# Patient Record
Sex: Female | Born: 1980 | Race: White | Hispanic: No | Marital: Single | Smoking: Current every day smoker
Health system: Southern US, Community
[De-identification: ages and names within clinical notes are randomized; demographics above are authoritative.]

## PROBLEM LIST (undated history)

## (undated) DIAGNOSIS — R51 Headache: Secondary | ICD-10-CM

## (undated) DIAGNOSIS — K219 Gastro-esophageal reflux disease without esophagitis: Secondary | ICD-10-CM

## (undated) DIAGNOSIS — R519 Headache, unspecified: Secondary | ICD-10-CM

## (undated) DIAGNOSIS — T7840XA Allergy, unspecified, initial encounter: Secondary | ICD-10-CM

## (undated) DIAGNOSIS — F419 Anxiety disorder, unspecified: Secondary | ICD-10-CM

## (undated) HISTORY — DX: Allergy, unspecified, initial encounter: T78.40XA

## (undated) HISTORY — PX: NO PAST SURGERIES: SHX2092

## (undated) HISTORY — DX: Gastro-esophageal reflux disease without esophagitis: K21.9

---

## 2015-09-13 ENCOUNTER — Encounter: Payer: Self-pay | Admitting: Emergency Medicine

## 2015-09-13 ENCOUNTER — Emergency Department
Admission: EM | Admit: 2015-09-13 | Discharge: 2015-09-13 | Disposition: A | Discharge status: Home / Self Care | Payer: Medicaid Other | Attending: Emergency Medicine | Admitting: Emergency Medicine

## 2015-09-13 DIAGNOSIS — F17200 Nicotine dependence, unspecified, uncomplicated: Secondary | ICD-10-CM | POA: Insufficient documentation

## 2015-09-13 DIAGNOSIS — R0982 Postnasal drip: Secondary | ICD-10-CM | POA: Diagnosis not present

## 2015-09-13 DIAGNOSIS — R0989 Other specified symptoms and signs involving the circulatory and respiratory systems: Secondary | ICD-10-CM | POA: Diagnosis present

## 2015-09-13 DIAGNOSIS — F458 Other somatoform disorders: Secondary | ICD-10-CM | POA: Diagnosis not present

## 2015-09-13 DIAGNOSIS — Z79899 Other long term (current) drug therapy: Secondary | ICD-10-CM | POA: Diagnosis not present

## 2015-09-13 LAB — POCT RAPID STREP A: Streptococcus, Group A Screen (Direct): NEGATIVE

## 2015-09-13 MED ORDER — LORATADINE 10 MG PO TABS: 10.0000 mg | ORAL_TABLET | Freq: Every day | ORAL | 0 refills | Status: DC

## 2015-09-13 MED ORDER — FLUTICASONE PROPIONATE 50 MCG/ACT NA SUSP: 2.0000 | Freq: Every day | NASAL | 0 refills | Status: AC

## 2015-09-13 NOTE — ED Provider Notes (Signed)
[facility] Emergency Department Provider Note  ____________________________________________  Time seen: Approximately 10:04 AM  I have reviewed the triage vital signs and the nursing notes.   HISTORY  Chief Complaint Sore Throat    HPI Amy Marshall is a 35 y.o. female , NAD, presents to the emergency department with three-day history of sensation of a lump in her throat. Patient states she has had sensation of lump in her throat for the past couple of days. Feels it when she swallows. Has had no pain or swelling to the throat. Did see a "bump" about the left tonsil 2 days ago but has not seen it today. Denies any specific injury or trauma that could've caused a foreign body. Has not noted any blood in her sputum. Denies any nasal congestion, runny nose, ear pain, sinus pressure. Denies any fevers, chills but did have some body aches last night and this morning. Has not taken anything over-the-counter for her symptoms. Patient verbalizes that she is concerned she may have strep throat as she has young children at home.  Does note she is being treated by gastroenterology for an ulcer and has an appointment next month for full workup.   History reviewed. No pertinent past medical history.  There are no active problems to display for this patient.   History reviewed. No pertinent surgical history.  Prior to Admission medications   Medication Sig Start Date End Date Taking? Authorizing Provider  pantoprazole (PROTONIX) 40 MG tablet Take 40 mg by mouth daily.   Yes Historical Provider, MD  sucralfate (CARAFATE) 1 g tablet Take 1 g by mouth 4 (four) times daily -  with meals and at bedtime.   Yes Historical Provider, MD  fluticasone (FLONASE) 50 MCG/ACT nasal spray Place 2 sprays into both nostrils daily. 09/13/15   Brailee Riede L Ana Liaw, PA-C  loratadine (CLARITIN) 10 MG tablet Take 1 tablet (10 mg total) by mouth daily. 09/13/15   Maizie Garno L Jonie Burdell, PA-C     Allergies Review of patient's allergies indicates no known allergies.  No family history on file.  Social History Social History  Substance Use Topics  . Smoking status: Current Every Day Smoker  . Smokeless tobacco: Never Used  . Alcohol use No     Review of Systems  Constitutional: No fever/chills Eyes: No visual changes. No discharge, Redness, swelling, pain  ENT:  No sore throat, Runny nose, nasal congestion, ear pain on the sneezing. Cardiovascular: No chest pain. Respiratory: No cough, Chest congestion. No shortness of breath. No wheezing.  Gastrointestinal: No abdominal pain.  No nausea, vomiting. Musculoskeletal: Positive general myalgias.  Skin: Negative for rash. Neurological: Negative for headaches, focal weakness or numbness. 10-point ROS otherwise negative.  ____________________________________________   PHYSICAL EXAM:  VITAL SIGNS: ED Triage Vitals  Enc Vitals Group     BP 09/13/15 0942 121/63     Pulse Rate 09/13/15 0942 70     Resp 09/13/15 0942 20     Temp 09/13/15 0942 98.2 F (36.8 C)     Temp Source 09/13/15 0942 Oral     SpO2 09/13/15 0942 96 %     Weight 09/13/15 0943 145 lb (65.8 kg)     Height 09/13/15 0943 5\' 2"  (1.575 m)     Head Circumference --      Peak Flow --      Pain Score 09/13/15 0943 3     Pain Loc --      Pain Edu? --  Excl. in GC? --      Constitutional: Alert and oriented. Well appearing and in no acute distress. Eyes: Conjunctivae are normal without icterus or injection. PERRL. EOMI without pain.  Head: Atraumatic. ENT:      Ears: Right TM visualized with mild serous effusion but no erythema, bulging, perforation. Left TM visualized without effusion, bulging, erythema, perforation.      Nose: No congestion but trace clear rhinnorhea.      Mouth/Throat: Mucous membranes are moist. Pharynx without erythema, swelling, exudate. Uvula is midline. Airway is patent. Clear postnasal drip. Neck: No stridor. No carotid  bruits. Supple with full range of motion. Hematological/Lymphatic/Immunilogical: No cervical, preauricular, postauricular, supraclavicular lymphadenopathy. Cardiovascular: Normal rate, regular rhythm. Normal S1 and S2.  Good peripheral circulation. Respiratory: Normal respiratory effort without tachypnea or retractions. Lungs CTAB with breath sounds noted in all lung fields. No wheeze, rhonchi, rales. Neurologic:  Normal speech and language. No gross focal neurologic deficits are appreciated.  Skin:  Skin is warm, dry and intact. No rash noted. Psychiatric: Mood and affect are normal. Speech and behavior are normal. Patient exhibits appropriate insight and judgement.   ____________________________________________   LABS (all labs ordered are listed, but only abnormal results are displayed)  Labs Reviewed  CULTURE, GROUP A STREP [facility])  POCT RAPID STREP A   ____________________________________________  EKG  None ____________________________________________  RADIOLOGY  None ____________________________________________    PROCEDURES  Procedure(s) performed: None   Procedures   Medications - No data to display   ____________________________________________   INITIAL IMPRESSION / ASSESSMENT AND PLAN / ED COURSE  Pertinent labs & imaging results that were available during my care of the patient were reviewed by me and considered in my medical decision making (see chart for details).  Clinical Course    Patient's diagnosis is consistent with Globus sensation due to postnasal drip. Patient will be discharged home with prescriptions for Flonase and Claritin to take as directed. Patient should continue Protonix and sucralfate as previously prescribed. Patient [facility] to [facility] or her GI specialist if symptoms persist past this treatment course. Patient is given ED precautions to return to the ED for any worsening or new symptoms.     ____________________________________________  FINAL CLINICAL IMPRESSION(S) / ED DIAGNOSES  Final diagnoses:  Globus sensation  Postnasal drip      NEW MEDICATIONS STARTED DURING THIS VISIT:  Discharge Medication List as of 09/13/2015 10:48 AM    START taking these medications   Details  fluticasone (FLONASE) 50 MCG/ACT nasal spray Place 2 sprays into both nostrils daily., Starting Wed 09/13/2015, Print    loratadine (CLARITIN) 10 MG tablet Take 1 tablet (10 mg total) by mouth daily., Starting Wed 09/13/2015, Print             Ernestene KielJami L New SalisburyHagler, PA-C 09/13/15 1111    Nita Sicklearolina Veronese, MD 09/14/15 [phone]

## 2015-09-13 NOTE — Discharge Instructions (Signed)
If not improving in 3-4 days, follow up with PCP or [facility]

## 2015-09-13 NOTE — ED Triage Notes (Signed)
States she felt like she has a lump in her throat for the past 2-3 days  Then developed some body aches last pm  Also states soreness moves into neck   Afebrile on arrival

## 2015-09-15 LAB — CULTURE, GROUP A STREP (THRC)

## 2015-10-11 ENCOUNTER — Other Ambulatory Visit: Payer: Self-pay | Admitting: Gastroenterology

## 2015-10-11 DIAGNOSIS — R1310 Dysphagia, unspecified: Secondary | ICD-10-CM

## 2015-10-11 DIAGNOSIS — R1013 Epigastric pain: Secondary | ICD-10-CM | POA: Insufficient documentation

## 2015-10-16 ENCOUNTER — Ambulatory Visit
Admission: RE | Admit: 2015-10-16 | Discharge: 2015-10-16 | Disposition: A | Discharge status: Home / Self Care | Payer: Medicaid Other | Source: Ambulatory Visit | Attending: Gastroenterology | Admitting: Gastroenterology

## 2015-10-16 DIAGNOSIS — R1310 Dysphagia, unspecified: Secondary | ICD-10-CM | POA: Diagnosis not present

## 2015-11-08 ENCOUNTER — Encounter: Payer: Self-pay | Admitting: *Deleted

## 2015-11-08 DIAGNOSIS — R1013 Epigastric pain: Secondary | ICD-10-CM | POA: Diagnosis present

## 2015-11-08 DIAGNOSIS — F17200 Nicotine dependence, unspecified, uncomplicated: Secondary | ICD-10-CM | POA: Diagnosis not present

## 2015-11-08 DIAGNOSIS — Z79899 Other long term (current) drug therapy: Secondary | ICD-10-CM | POA: Insufficient documentation

## 2015-11-08 DIAGNOSIS — K8020 Calculus of gallbladder without cholecystitis without obstruction: Secondary | ICD-10-CM | POA: Diagnosis not present

## 2015-11-08 LAB — URINALYSIS COMPLETE WITH MICROSCOPIC (ARMC ONLY)
Bacteria, UA: NONE SEEN
Bilirubin Urine: NEGATIVE
Glucose, UA: NEGATIVE mg/dL
KETONES UR: NEGATIVE mg/dL
LEUKOCYTES UA: NEGATIVE
NITRITE: NEGATIVE
PH: 6 (ref 5.0–8.0)
PROTEIN: NEGATIVE mg/dL
SPECIFIC GRAVITY, URINE: 1.008 (ref 1.005–1.030)

## 2015-11-08 LAB — COMPREHENSIVE METABOLIC PANEL
ALK PHOS: 64 U/L (ref 38–126)
ALT: 16 U/L (ref 14–54)
ANION GAP: 7 (ref 5–15)
AST: 17 U/L (ref 15–41)
Albumin: 4.5 g/dL (ref 3.5–5.0)
BUN: 11 mg/dL (ref 6–20)
CALCIUM: 9 mg/dL (ref 8.9–10.3)
CO2: 25 mmol/L (ref 22–32)
Chloride: 106 mmol/L (ref 101–111)
Creatinine, Ser: 0.53 mg/dL (ref 0.44–1.00)
Glucose, Bld: 108 mg/dL — ABNORMAL HIGH (ref 65–99)
Potassium: 3.7 mmol/L (ref 3.5–5.1)
SODIUM: 138 mmol/L (ref 135–145)
TOTAL PROTEIN: 7.3 g/dL (ref 6.5–8.1)
Total Bilirubin: 0.5 mg/dL (ref 0.3–1.2)

## 2015-11-08 LAB — POCT PREGNANCY, URINE: PREG TEST UR: NEGATIVE

## 2015-11-08 LAB — CBC
HCT: 43.9 % (ref 35.0–47.0)
HEMOGLOBIN: 14.9 g/dL (ref 12.0–16.0)
MCH: 30.9 pg (ref 26.0–34.0)
MCHC: 34.1 g/dL (ref 32.0–36.0)
MCV: 90.7 fL (ref 80.0–100.0)
Platelets: 210 10*3/uL (ref 150–440)
RBC: 4.84 MIL/uL (ref 3.80–5.20)
RDW: 12.4 % (ref 11.5–14.5)
WBC: 12.5 10*3/uL — AB (ref 3.6–11.0)

## 2015-11-08 LAB — LIPASE, BLOOD: Lipase: 24 U/L (ref 11–51)

## 2015-11-08 NOTE — ED Triage Notes (Addendum)
Pt has upper abd pain.  Pt vomited x 1 today.  No diarrhea.  Hx of ulcers.  Pt tried otc meds without relief of pain today.  Pt denies urinary sx.  No vag bleeding.

## 2015-11-09 ENCOUNTER — Emergency Department: Payer: Medicaid Other

## 2015-11-09 ENCOUNTER — Emergency Department
Admission: EM | Admit: 2015-11-09 | Discharge: 2015-11-09 | Disposition: A | Discharge status: Home / Self Care | Payer: Medicaid Other | Attending: Emergency Medicine | Admitting: Emergency Medicine

## 2015-11-09 ENCOUNTER — Encounter: Payer: Self-pay | Admitting: Emergency Medicine

## 2015-11-09 DIAGNOSIS — R1013 Epigastric pain: Secondary | ICD-10-CM

## 2015-11-09 DIAGNOSIS — K8020 Calculus of gallbladder without cholecystitis without obstruction: Secondary | ICD-10-CM

## 2015-11-09 MED ORDER — ONDANSETRON 4 MG PO TBDP: 4.0000 mg | ORAL_TABLET | Freq: Three times a day (TID) | ORAL | 0 refills | Status: DC | PRN

## 2015-11-09 MED ORDER — SODIUM CHLORIDE 0.9 % IV BOLUS (SEPSIS)
500.0000 mL | Freq: Once | INTRAVENOUS | Status: AC
  Administered 2015-11-09: 500 mL via INTRAVENOUS

## 2015-11-09 MED ORDER — FAMOTIDINE IN NACL 20-0.9 MG/50ML-% IV SOLN
20.0000 mg | Freq: Once | INTRAVENOUS | Status: AC
  Administered 2015-11-09: 20 mg via INTRAVENOUS
  Filled 2015-11-09: qty 50

## 2015-11-09 MED ORDER — OXYCODONE-ACETAMINOPHEN 5-325 MG PO TABS: 1.0000 | ORAL_TABLET | ORAL | 0 refills | Status: DC | PRN

## 2015-11-09 MED ORDER — ONDANSETRON HCL 4 MG/2ML IJ SOLN
4.0000 mg | Freq: Once | INTRAMUSCULAR | Status: AC
  Administered 2015-11-09: 4 mg via INTRAVENOUS
  Filled 2015-11-09: qty 2

## 2015-11-09 MED ORDER — MORPHINE SULFATE (PF) 2 MG/ML IV SOLN
2.0000 mg | Freq: Once | INTRAVENOUS | Status: AC
  Administered 2015-11-09: 2 mg via INTRAVENOUS
  Filled 2015-11-09: qty 1

## 2015-11-09 NOTE — Discharge Instructions (Signed)
1. Take medicines as needed for pain & nausea (Percocet/Zofran #20). 2. Clear liquids x 12 hours, then bland diet x 1 week, then slowly advance diet as tolerated. Avoid fatty, greasy, spicy foods and drinks. 3. Return to the ER for worsening symptoms, persistent vomiting, fever, difficulty breathing or other concerns.  

## 2015-11-09 NOTE — ED Notes (Signed)
Pt taken to US

## 2015-11-09 NOTE — ED Provider Notes (Signed)
[facility] Emergency Department Provider Note   ____________________________________________   First MD Initiated Contact with Patient 11/09/15 0115     (approximate)  I have reviewed the triage vital signs and the nursing notes.   HISTORY  Chief Complaint Abdominal Pain    HPI Amy Marshall is a 35 y.o. female who presents to the ED from home with a chief complaint of abdominal pain. Patient reports history of ulcers, on Protonix and Carafate. Reports onset of epigastric burning discomfort approximately 8 PM. Pain radiates straight through to her back. Ate scrambled eggs for dinner. Symptoms associated with nausea/vomiting x 1. Denies associated fever, chills, chest pain, shortness of breath, dysuria, diarrhea. Denies recent travel or trauma. Nothing makes her symptoms better or worse.   Past medical history Ulcers  There are no active problems to display for this patient.   History reviewed. No pertinent surgical history.  Prior to Admission medications   Medication Sig Start Date End Date Taking? Authorizing Provider  pantoprazole (PROTONIX) 40 MG tablet Take 40 mg by mouth daily.   Yes Historical Provider, MD  sucralfate (CARAFATE) 1 g tablet Take 1 g by mouth 4 (four) times daily -  with meals and at bedtime.   Yes Historical Provider, MD  fluticasone (FLONASE) 50 MCG/ACT nasal spray Place 2 sprays into both nostrils daily. 09/13/15   Jami L Hagler, PA-C  loratadine (CLARITIN) 10 MG tablet Take 1 tablet (10 mg total) by mouth daily. 09/13/15   Jami L Hagler, PA-C  ondansetron (ZOFRAN ODT) 4 MG disintegrating tablet Take 1 tablet (4 mg total) by mouth every 8 (eight) hours as needed for nausea or vomiting. 11/09/15   Irean Hong, MD  oxyCODONE-acetaminophen (ROXICET) 5-325 MG tablet Take 1 tablet by mouth every 4 (four) hours as needed for severe pain. 11/09/15   Irean Hong, MD    Allergies Review of patient's allergies indicates no known  allergies.  History reviewed. No pertinent family history.  Social History Social History  Substance Use Topics  . Smoking status: Current Every Day Smoker  . Smokeless tobacco: Never Used  . Alcohol use No    Review of Systems  Constitutional: No fever/chills. Eyes: No visual changes. ENT: No sore throat. Cardiovascular: Denies chest pain. Respiratory: Denies shortness of breath. Gastrointestinal: Positive for abdominal pain, nausea and vomiting.  No diarrhea.  No constipation. Genitourinary: Negative for dysuria. Musculoskeletal: Negative for back pain. Skin: Negative for rash. Neurological: Negative for headaches, focal weakness or numbness.  10-point ROS otherwise negative.  ____________________________________________   PHYSICAL EXAM:  VITAL SIGNS: ED Triage Vitals [11/08/15 2245]  Enc Vitals Group     BP 129/82     Pulse Rate 100     Resp 20     Temp 97.9 F (36.6 C)     Temp Source Oral     SpO2 100 %     Weight 138 lb (62.6 kg)     Height 5\' 2"  (1.575 m)     Head Circumference      Peak Flow      Pain Score 10     Pain Loc      Pain Edu?      Excl. in GC?     Constitutional: Alert and oriented. Well appearing and in no acute distress. Eyes: Conjunctivae are normal. PERRL. EOMI. Head: Atraumatic. Nose: No congestion/rhinnorhea. Mouth/Throat: Mucous membranes are moist.  Oropharynx non-erythematous. Neck: No stridor.   Cardiovascular: Normal rate, regular rhythm.  Grossly normal heart sounds.  Good peripheral circulation. Respiratory: Normal respiratory effort.  No retractions. Lungs CTAB. Gastrointestinal: Soft and mildly tender to palpation epigastrium without rebound or guarding. No distention. No abdominal bruits. No CVA tenderness. Musculoskeletal: No lower extremity tenderness nor edema.  No joint effusions. Neurologic:  Normal speech and language. No gross focal neurologic deficits are appreciated. No gait instability. Skin:  Skin is warm,  dry and intact. No rash noted. Psychiatric: Mood and affect are normal. Speech and behavior are normal.  ____________________________________________   LABS (all labs ordered are listed, but only abnormal results are displayed)  Labs Reviewed  COMPREHENSIVE METABOLIC PANEL - Abnormal; Notable for the following:       Result Value   Glucose, Bld 108 (*)    All other components within normal limits  CBC - Abnormal; Notable for the following:    WBC 12.5 (*)    All other components within normal limits  URINALYSIS COMPLETEWITH MICROSCOPIC ([facility] ONLY) - Abnormal; Notable for the following:    Color, Urine STRAW (*)    APPearance CLEAR (*)    Hgb urine dipstick 2+ (*)    Squamous Epithelial / LPF 0-5 (*)    All other components within normal limits  LIPASE, BLOOD  POC URINE PREG, ED  POCT PREGNANCY, URINE   ____________________________________________  EKG  None ____________________________________________  RADIOLOGY  RUQ ultrasound interpreted per Dr. Gwenyth Benderadparvar: Cholelithiasis without sonographic evidence of acute cholecystitis. ____________________________________________   PROCEDURES  Procedure(s) performed: None  Procedures  Critical Care performed: No  ____________________________________________   INITIAL IMPRESSION / ASSESSMENT AND PLAN / ED COURSE  Pertinent labs & imaging results that were available during my care of the patient were reviewed by me and considered in my medical decision making (see chart for details).  35 year old female with a history of ulcers who presents with burning-type epigastric discomfort. Laboratory and urinalysis results are unremarkable. Will administer IV Pepcid, analgesia and proceed with ultrasound to evaluate cholecystitis.  Clinical Course  Comment By Time  Patient resting in no acute distress. Updated her of ultrasound results. Plan for prescriptions for analgesia, antiemetic and outpatient general surgery follow-up.  Strict return precautions given. Patient verbalizes understanding and agree with plan. Irean HongJade J Sung, MD 10/26 [phone]     ____________________________________________   FINAL CLINICAL IMPRESSION(S) / ED DIAGNOSES  Final diagnoses:  Epigastric pain  Calculus of gallbladder without cholecystitis without obstruction      NEW MEDICATIONS STARTED DURING THIS VISIT:  New Prescriptions   ONDANSETRON (ZOFRAN ODT) 4 MG DISINTEGRATING TABLET    Take 1 tablet (4 mg total) by mouth every 8 (eight) hours as needed for nausea or vomiting.   OXYCODONE-ACETAMINOPHEN (ROXICET) 5-325 MG TABLET    Take 1 tablet by mouth every 4 (four) hours as needed for severe pain.     Note:  This document was prepared using Dragon voice recognition software and may include unintentional dictation errors.    Irean HongJade J Sung, MD 11/09/15 [phone]

## 2015-11-09 NOTE — ED Notes (Signed)
Dr. Sung at bedside.  

## 2015-11-10 ENCOUNTER — Other Ambulatory Visit: Payer: Self-pay

## 2015-11-13 ENCOUNTER — Emergency Department
Admission: EM | Admit: 2015-11-13 | Discharge: 2015-11-13 | Disposition: A | Discharge status: Home / Self Care | Payer: Medicaid Other | Attending: Emergency Medicine | Admitting: Emergency Medicine

## 2015-11-13 ENCOUNTER — Encounter: Payer: Self-pay | Admitting: Emergency Medicine

## 2015-11-13 ENCOUNTER — Other Ambulatory Visit: Payer: Self-pay

## 2015-11-13 DIAGNOSIS — Z79899 Other long term (current) drug therapy: Secondary | ICD-10-CM | POA: Diagnosis not present

## 2015-11-13 DIAGNOSIS — F17200 Nicotine dependence, unspecified, uncomplicated: Secondary | ICD-10-CM | POA: Insufficient documentation

## 2015-11-13 DIAGNOSIS — R1010 Upper abdominal pain, unspecified: Secondary | ICD-10-CM | POA: Diagnosis present

## 2015-11-13 DIAGNOSIS — K8050 Calculus of bile duct without cholangitis or cholecystitis without obstruction: Secondary | ICD-10-CM | POA: Diagnosis not present

## 2015-11-13 DIAGNOSIS — R110 Nausea: Secondary | ICD-10-CM

## 2015-11-13 LAB — COMPREHENSIVE METABOLIC PANEL
ALBUMIN: 4.6 g/dL (ref 3.5–5.0)
ALK PHOS: 66 U/L (ref 38–126)
ALT: 17 U/L (ref 14–54)
AST: 20 U/L (ref 15–41)
Anion gap: 7 (ref 5–15)
BUN: 10 mg/dL (ref 6–20)
CALCIUM: 9.2 mg/dL (ref 8.9–10.3)
CHLORIDE: 105 mmol/L (ref 101–111)
CO2: 26 mmol/L (ref 22–32)
CREATININE: 0.64 mg/dL (ref 0.44–1.00)
GFR calc Af Amer: >60 mL/min (ref >60)
GFR calc non Af Amer: >60 mL/min (ref >60)
GLUCOSE: 119 mg/dL — AB (ref 65–99)
Potassium: 4 mmol/L (ref 3.5–5.1)
SODIUM: 138 mmol/L (ref 135–145)
Total Bilirubin: 0.6 mg/dL (ref 0.3–1.2)
Total Protein: 7.8 g/dL (ref 6.5–8.1)

## 2015-11-13 LAB — CBC
HCT: 44.6 % (ref 35.0–47.0)
Hemoglobin: 15.2 g/dL (ref 12.0–16.0)
MCH: 30.8 pg (ref 26.0–34.0)
MCHC: 34.2 g/dL (ref 32.0–36.0)
MCV: 90.2 fL (ref 80.0–100.0)
PLATELETS: 225 10*3/uL (ref 150–440)
RBC: 4.95 MIL/uL (ref 3.80–5.20)
RDW: 12.4 % (ref 11.5–14.5)
WBC: 19 10*3/uL — ABNORMAL HIGH (ref 3.6–11.0)

## 2015-11-13 LAB — LIPASE, BLOOD: LIPASE: 19 U/L (ref 11–51)

## 2015-11-13 MED ORDER — ONDANSETRON 4 MG PO TBDP: 4.0000 mg | ORAL_TABLET | Freq: Three times a day (TID) | ORAL | 0 refills | Status: DC | PRN

## 2015-11-13 MED ORDER — CEPHALEXIN 500 MG PO CAPS: 500.0000 mg | ORAL_CAPSULE | Freq: Three times a day (TID) | ORAL | 0 refills | Status: DC

## 2015-11-13 MED ORDER — OXYCODONE-ACETAMINOPHEN 5-325 MG PO TABS
1.0000 | ORAL_TABLET | Freq: Once | ORAL | Status: AC
  Administered 2015-11-13: 1 via ORAL

## 2015-11-13 MED ORDER — MORPHINE SULFATE (PF) 2 MG/ML IV SOLN
4.0000 mg | Freq: Once | INTRAVENOUS | Status: AC
  Administered 2015-11-13: 4 mg via INTRAVENOUS

## 2015-11-13 MED ORDER — OXYCODONE-ACETAMINOPHEN 10-325 MG PO TABS: 1.0000 | ORAL_TABLET | Freq: Four times a day (QID) | ORAL | 0 refills | Status: DC | PRN

## 2015-11-13 MED ORDER — ONDANSETRON 4 MG PO TBDP
ORAL_TABLET | ORAL | Status: AC
Start: 2015-11-13 — End: 2015-11-13
  Administered 2015-11-13: 4 mg via ORAL
  Filled 2015-11-13: qty 1

## 2015-11-13 MED ORDER — HYDROMORPHONE HCL 1 MG/ML IJ SOLN
INTRAMUSCULAR | Status: AC
  Administered 2015-11-13: 0.5 mg via INTRAVENOUS
  Filled 2015-11-13: qty 1

## 2015-11-13 MED ORDER — CEPHALEXIN 500 MG PO CAPS
ORAL_CAPSULE | ORAL | Status: AC
  Administered 2015-11-13: 500 mg via ORAL
  Filled 2015-11-13: qty 1

## 2015-11-13 MED ORDER — HYDROMORPHONE HCL 1 MG/ML IJ SOLN
0.5000 mg | Freq: Once | INTRAMUSCULAR | Status: AC
  Administered 2015-11-13: 0.5 mg via INTRAVENOUS

## 2015-11-13 MED ORDER — ONDANSETRON 4 MG PO TBDP
4.0000 mg | ORAL_TABLET | Freq: Once | ORAL | Status: AC
  Administered 2015-11-13: 4 mg via ORAL

## 2015-11-13 MED ORDER — OXYCODONE-ACETAMINOPHEN 5-325 MG PO TABS
ORAL_TABLET | ORAL | Status: AC
  Administered 2015-11-13: 1 via ORAL
  Filled 2015-11-13: qty 2

## 2015-11-13 MED ORDER — MORPHINE SULFATE (PF) 2 MG/ML IV SOLN
INTRAVENOUS | Status: AC
  Administered 2015-11-13: 4 mg via INTRAVENOUS
  Filled 2015-11-13: qty 1

## 2015-11-13 MED ORDER — ONDANSETRON HCL 4 MG/2ML IJ SOLN
4.0000 mg | Freq: Once | INTRAMUSCULAR | Status: AC
  Administered 2015-11-13: 4 mg via INTRAVENOUS
  Filled 2015-11-13: qty 2

## 2015-11-13 MED ORDER — ONDANSETRON HCL 4 MG/2ML IJ SOLN
4.0000 mg | Freq: Once | INTRAMUSCULAR | Status: AC
  Administered 2015-11-13: 4 mg via INTRAVENOUS

## 2015-11-13 MED ORDER — CEPHALEXIN 500 MG PO CAPS
500.0000 mg | ORAL_CAPSULE | Freq: Once | ORAL | Status: AC
  Administered 2015-11-13: 500 mg via ORAL

## 2015-11-13 MED ORDER — ONDANSETRON HCL 4 MG/2ML IJ SOLN
4.0000 mg | Freq: Once | INTRAMUSCULAR | Status: AC | PRN
  Administered 2015-11-13: 4 mg via INTRAVENOUS
  Filled 2015-11-13 (×2): qty 2

## 2015-11-13 MED ORDER — MORPHINE SULFATE (PF) 2 MG/ML IV SOLN
4.0000 mg | Freq: Once | INTRAVENOUS | Status: AC
  Administered 2015-11-13: 4 mg via INTRAVENOUS
  Filled 2015-11-13: qty 2

## 2015-11-13 MED ORDER — MORPHINE SULFATE (PF) 2 MG/ML IV SOLN
INTRAVENOUS | Status: AC
  Filled 2015-11-13: qty 1

## 2015-11-13 NOTE — ED Triage Notes (Signed)
Pt seen here this past Wednesday and diagnosed with gallstones; has appointment this next Wednesday with surgeon; pt says taking prescribed percocet for pain with no relief; also taking Zofran with little relief;

## 2015-11-13 NOTE — ED Notes (Signed)
Pt discharged to home.  Family member driving.  Discharge instructions reviewed.  Verbalized understanding.  No questions or concerns at this time.  Teach back verified.  Pt in NAD.  No items left in ED.   

## 2015-11-13 NOTE — Discharge Instructions (Signed)
1. Take pain and nausea medicines as needed (Percocet/Zofran). 2. Continue bland diet. Avoid greasy, spicy foods and alcohol. 3. Return to the ER for worsening symptoms, persistent vomiting, fever, difficulty breathing or other concerns.

## 2015-11-13 NOTE — ED Provider Notes (Signed)
[facility] Emergency Department Provider Note   ____________________________________________   First MD Initiated Contact with Patient 11/13/15 0423     (approximate)  I have reviewed the triage vital signs and the nursing notes.   HISTORY  Chief Complaint Abdominal Pain; Nausea; and Emesis    HPI Amy Marshall is a 35 y.o. female who returns to the ED from home with a chief complaint of abdominal pain. Patient wasseen for same on 10/26, found to have gallstones on ultrasound and discharged home with Percocet. Patient reports she has an appointment with the surgeon on November 1, but presents for increased abdominal pain last evening. Symptoms associated with nausea only. Denies fever, chills, chest pain, shortness of breath, diarrhea, dysuria. Denies recent travel or trauma. Nothing makes her symptoms better or worse.   Past medical history Ulcers  Patient Active Problem List   Diagnosis Date Noted  . Dyspepsia 10/11/2015    History reviewed. No pertinent surgical history.  Prior to Admission medications   Medication Sig Start Date End Date Taking? Authorizing Provider  fluticasone (FLONASE) 50 MCG/ACT nasal spray Place 2 sprays into both nostrils daily. 09/13/15  Yes Jami L Hagler, PA-C  loratadine (CLARITIN) 10 MG tablet Take 1 tablet (10 mg total) by mouth daily. 09/13/15  Yes Jami L Hagler, PA-C  pantoprazole (PROTONIX) 40 MG tablet Take by mouth.   Yes Historical Provider, MD  sucralfate (CARAFATE) 1 g tablet Take 1 g by mouth 4 (four) times daily -  with meals and at bedtime.   Yes Historical Provider, MD  sucralfate (CARAFATE) 1 g tablet Take by mouth.   Yes Historical Provider, MD  cephALEXin (KEFLEX) 500 MG capsule Take 1 capsule (500 mg total) by mouth 3 (three) times daily. 11/13/15   Irean HongJade J Stanford Strauch, MD  ondansetron (ZOFRAN ODT) 4 MG disintegrating tablet Take 1 tablet (4 mg total) by mouth every 8 (eight) hours as needed for nausea or  vomiting. 11/13/15   Irean HongJade J Greycen Felter, MD  oxyCODONE-acetaminophen (PERCOCET) 10-325 MG tablet Take 1 tablet by mouth every 6 (six) hours as needed for pain. 11/13/15   Irean HongJade J Franshesca Chipman, MD    Allergies Codeine  History reviewed. No pertinent family history.  Social History Social History  Substance Use Topics  . Smoking status: Current Every Day Smoker  . Smokeless tobacco: Never Used  . Alcohol use No    Review of Systems  Constitutional: No fever/chills. Eyes: No visual changes. ENT: No sore throat. Cardiovascular: Denies chest pain. Respiratory: Denies shortness of breath. Gastrointestinal: Positive for abdominal pain.  Positive for nausea, no vomiting.  No diarrhea.  No constipation. Genitourinary: Negative for dysuria. Musculoskeletal: Negative for back pain. Skin: Negative for rash. Neurological: Negative for headaches, focal weakness or numbness.  10-point ROS otherwise negative.  ____________________________________________   PHYSICAL EXAM:  VITAL SIGNS: ED Triage Vitals  Enc Vitals Group     BP 11/13/15 0200 133/84     Pulse Rate 11/13/15 0200 80     Resp 11/13/15 0200 18     Temp 11/13/15 0200 98.4 F (36.9 C)     Temp Source 11/13/15 0200 Oral     SpO2 11/13/15 0200 100 %     Weight 11/13/15 0200 138 lb (62.6 kg)     Height 11/13/15 0200 5\' 2"  (1.575 m)     Head Circumference --      Peak Flow --      Pain Score 11/13/15 0202 10  Pain Loc --      Pain Edu? --      Excl. in GC? --     Constitutional: Alert and oriented. Well appearing and in mild acute distress. Eyes: Conjunctivae are normal. PERRL. EOMI. Head: Atraumatic. Nose: No congestion/rhinnorhea. Mouth/Throat: Mucous membranes are moist.  Oropharynx non-erythematous. Neck: No stridor.   Cardiovascular: Normal rate, regular rhythm. Grossly normal heart sounds.  Good peripheral circulation. Respiratory: Normal respiratory effort.  No retractions. Lungs CTAB. Gastrointestinal: Soft and tender  to palpation epigastrium and right upper quadrant without rebound or guarding. No distention. No abdominal bruits. No CVA tenderness. Musculoskeletal: No lower extremity tenderness nor edema.  No joint effusions. Neurologic:  Normal speech and language. No gross focal neurologic deficits are appreciated. No gait instability. Skin:  Skin is warm, dry and intact. No rash noted. Psychiatric: Mood and affect are normal. Speech and behavior are normal.  ____________________________________________   LABS (all labs ordered are listed, but only abnormal results are displayed)  Labs Reviewed  COMPREHENSIVE METABOLIC PANEL - Abnormal; Notable for the following:       Result Value   Glucose, Bld 119 (*)    All other components within normal limits  CBC - Abnormal; Notable for the following:    WBC 19.0 (*)    All other components within normal limits  LIPASE, BLOOD   ____________________________________________  EKG  None ____________________________________________  RADIOLOGY  None ____________________________________________   PROCEDURES  Procedure(s) performed: None  Procedures  Critical Care performed: No  ____________________________________________   INITIAL IMPRESSION / ASSESSMENT AND PLAN / ED COURSE  Pertinent labs & imaging results that were available during my care of the patient were reviewed by me and considered in my medical decision making (see chart for details).  35 year old female who returns to the emergency department for persistent pain associated with cholelithiasis. Leukocytosis noted. LFTs and lipase remain normal. We'll administer IV analgesia and reassess.  Clinical Course  Comment By Time  Spoke with Dr. Excell Seltzerooper from general surgery who does not recommend repeat ultrasound.Goal to control patient's pain and discharge her to home with follow-up as scheduled in 2 days. Irean HongJade J Charletta Voight, MD 10/30 [phone]  Patient's pain down to 4/10 after Dilaudid. Will  prescribe 10 mg Percocet along with ODT Zofran. Will initiate Keflex empirically. Patient has an appointment on November 1 with surgery. Strict return precautions given. Patient and spouse verbalize understanding and agree with plan of care. Irean HongJade J Leaira Fullam, MD 10/30 0555     ____________________________________________   FINAL CLINICAL IMPRESSION(S) / ED DIAGNOSES  Final diagnoses:  Pain of upper abdomen  Nausea  Biliary colic      NEW MEDICATIONS STARTED DURING THIS VISIT:  New Prescriptions   CEPHALEXIN (KEFLEX) 500 MG CAPSULE    Take 1 capsule (500 mg total) by mouth 3 (three) times daily.   ONDANSETRON (ZOFRAN ODT) 4 MG DISINTEGRATING TABLET    Take 1 tablet (4 mg total) by mouth every 8 (eight) hours as needed for nausea or vomiting.   OXYCODONE-ACETAMINOPHEN (PERCOCET) 10-325 MG TABLET    Take 1 tablet by mouth every 6 (six) hours as needed for pain.     Note:  This document was prepared using Dragon voice recognition software and may include unintentional dictation errors.    Irean HongJade J Shamal Stracener, MD 11/13/15 [phone]

## 2015-11-15 ENCOUNTER — Ambulatory Visit (INDEPENDENT_AMBULATORY_CARE_PROVIDER_SITE_OTHER): Payer: Medicaid Other | Admitting: Surgery

## 2015-11-15 ENCOUNTER — Encounter: Payer: Self-pay | Admitting: Surgery

## 2015-11-15 VITALS — BP 116/71 | HR 88 | Temp 98.5°F | Ht 62.0 in | Wt 136.8 lb

## 2015-11-15 DIAGNOSIS — K8020 Calculus of gallbladder without cholecystitis without obstruction: Secondary | ICD-10-CM

## 2015-11-15 MED ORDER — AMOXICILLIN-POT CLAVULANATE 875-125 MG PO TABS: 1.0000 | ORAL_TABLET | Freq: Two times a day (BID) | ORAL | 0 refills | Status: DC

## 2015-11-15 NOTE — Patient Instructions (Signed)
We will start you on a different antibiotic for your gallbladder. Stop the Keflex and start the Augmentin today. Take this medication until it is completely gone.  Please review information below regarding gallstones. If you have severe pain, nausea and vomiting, or fever >100.5 please call our office immediately and speak with a nurse. If this occurs after hours, please go to the Emergency Room and we will have a surgeon see you there.  Please see your follow-up information below.  Call with any questions or concerns that you may have.    Cholelithiasis Cholelithiasis (also called gallstones) is a form of gallbladder disease in which gallstones form in your gallbladder. The gallbladder is an organ that stores bile made in the liver, which helps digest fats. Gallstones begin as small crystals and slowly grow into stones. Gallstone pain occurs when the gallbladder spasms and a gallstone is blocking the duct. Pain can also occur when a stone passes out of the duct.  RISK FACTORS  Being female.   Having multiple pregnancies. Health care providers sometimes advise removing diseased gallbladders before future pregnancies.   Being obese.  Eating a diet heavy in fried foods and fat.   Being older than 60 years and increasing age.   Prolonged use of medicines containing female hormones.   Having diabetes mellitus.   Rapidly losing weight.   Having a family history of gallstones (heredity).  SYMPTOMS  Nausea.   Vomiting.  Abdominal pain.   Yellowing of the skin (jaundice).   Sudden pain. It may persist from several minutes to several hours.  Fever.   Tenderness to the touch. In some cases, when gallstones do not move into the bile duct, people have no pain or symptoms. These are called "silent" gallstones.  TREATMENT Silent gallstones do not need treatment. In severe cases, emergency surgery may be required. Options for treatment include:  Surgery to remove the  gallbladder. This is the most common treatment.  Medicines. These do not always work and may take 6-12 months or more to work.  Shock wave treatment (extracorporeal biliary lithotripsy). In this treatment an ultrasound machine sends shock waves to the gallbladder to break gallstones into smaller pieces that can pass into the intestines or be dissolved by medicine. HOME CARE INSTRUCTIONS   Only take over-the-counter or prescription medicines for pain, discomfort, or fever as directed by your health care provider.   Follow a low-fat diet until seen again by your health care provider. Fat causes the gallbladder to contract, which can result in pain.   Follow up with your health care provider as directed. Attacks are almost always recurrent and surgery is usually required for permanent treatment.  SEEK IMMEDIATE MEDICAL CARE IF:   Your pain increases and is not controlled by medicines.   You have a fever or persistent symptoms for more than 2-3 days.   You have a fever and your symptoms suddenly get worse.   You have persistent nausea and vomiting.  MAKE SURE YOU:   Understand these instructions.  Will watch your condition.  Will get help right away if you are not doing well or get worse.   This information is not intended to replace advice given to you by your health care provider. Make sure you discuss any questions you have with your health care provider.   Document Released: 12/27/2004 Document Revised: 09/02/2012 Document Reviewed: 06/24/2012 Elsevier Interactive Patient Education 2016 Elsevier Inc.     Low-Fat Diet for Pancreatitis or Gallbladder Conditions A low-fat  diet can be helpful if you have pancreatitis or a gallbladder condition. With these conditions, your pancreas and gallbladder have trouble digesting fats. A healthy eating plan with less fat will help rest your pancreas and gallbladder and reduce your symptoms. WHAT DO I NEED TO KNOW ABOUT THIS  DIET?  Eat a low-fat diet.  Reduce your fat intake to less than 20-30% of your total daily calories. This is less than 50-60 g of fat per day.  Remember that you need some fat in your diet. Ask your dietician what your daily goal should be.  Choose nonfat and low-fat healthy foods. Look for the words "nonfat," "low fat," or "fat free."  As a guide, look on the label and choose foods with less than 3 g of fat per serving. Eat only one serving.  Avoid alcohol.  Do not smoke. If you need help quitting, talk with your health care provider.  Eat small frequent meals instead of three large heavy meals. WHAT FOODS CAN I EAT? Grains Include healthy grains and starches such as potatoes, wheat bread, fiber-rich cereal, and brown rice. Choose whole grain options whenever possible. In adults, whole grains should account for 45-65% of your daily calories.  Fruits and Vegetables Eat plenty of fruits and vegetables. Fresh fruits and vegetables add fiber to your diet. Meats and Other Protein Sources Eat lean meat such as chicken and pork. Trim any fat off of meat before cooking it. Eggs, fish, and beans are other sources of protein. In adults, these foods should account for 10-35% of your daily calories. Dairy Choose low-fat milk and dairy options. Dairy includes fat and protein, as well as calcium.  Fats and Oils Limit high-fat foods such as fried foods, sweets, baked goods, sugary drinks.  Other Creamy sauces and condiments, such as mayonnaise, can add extra fat. Think about whether or not you need to use them, or use smaller amounts or low fat options. WHAT FOODS ARE NOT RECOMMENDED?  High fat foods, such as:  Baked goods.  [employer] cream.  [place] toast.  Sweet rolls.  Pizza.  Cheese bread.  Foods covered with batter, butter, creamy sauces, or cheese.  Fried foods.  Sugary drinks and desserts.  Foods that cause gas or bloating   This information is not intended to replace advice  given to you by your health care provider. Make sure you discuss any questions you have with your health care provider.   Document Released: 01/05/2013 Document Reviewed: 01/05/2013 Elsevier Interactive Patient Education [employer]2016 Elsevier Inc.

## 2015-11-15 NOTE — Progress Notes (Signed)
11/15/2015  Reason for Visit:  cholelithiasis  History of Present Illness: Barbette Orlizabeth Sporrer is a 35 y.o. female who presents with 2 episodes of biliary colic over the past week. Patient initially presented to the emergency room on 10/26 and was found to have gallstones on ultrasound was discharged to home with Percocet. She reported once again on 10/30 and was discharged to home again as her pain was manageable with medications. She did have a leukocytosis with white blood cell count of 19 and was given a prescription for Keflex. No ultrasound was repeated at that time. She was referred to the surgery office for further evaluation.  She reports having had these 2 episodes of pain and possibly minor episodes over the past 2 years which she thought were related to her ulcers. She reports associated nausea with vomiting but no fevers, chills. She does report that she had a fatty meal before these episodes started. Currently her pain is much improved but she still has some mild soreness. The pain was in the epigastric and right upper quadrant region with some radiation to the back.  Past Medical History: Past Medical History:  Diagnosis Date  . Allergy   . Cholelithiasis   . GERD (gastroesophageal reflux disease)      Past Surgical History: Past Surgical History:  Procedure Laterality Date  . NO PAST SURGERIES      Home Medications: Prior to Admission medications   Medication Sig Start Date End Date Taking? Authorizing Provider  fluticasone (FLONASE) 50 MCG/ACT nasal spray Place 2 sprays into both nostrils daily. 09/13/15  Yes Jami L Hagler, PA-C  loratadine (CLARITIN) 10 MG tablet Take 1 tablet (10 mg total) by mouth daily. 09/13/15  Yes Jami L Hagler, PA-C  mupirocin ointment (BACTROBAN) 2 % APP EXT AA TID 11/07/15  Yes Historical Provider, MD  ondansetron (ZOFRAN ODT) 4 MG disintegrating tablet Take 1 tablet (4 mg total) by mouth every 8 (eight) hours as needed for nausea or vomiting.  11/13/15  Yes Irean HongJade J Sung, MD  oxyCODONE-acetaminophen (PERCOCET/ROXICET) 5-325 MG tablet TK 1 T PO Q 4 H PRF SEVERE PAIN 11/09/15  Yes Historical Provider, MD  pantoprazole (PROTONIX) 40 MG tablet Take by mouth.   Yes Historical Provider, MD  sucralfate (CARAFATE) 1 g tablet Take by mouth.   Yes Historical Provider, MD  triamcinolone cream (KENALOG) 0.1 % APP EXT AA BID FOR UP TO 2 WKS 11/07/15  Yes Historical Provider, MD  amoxicillin-clavulanate (AUGMENTIN) 875-125 MG tablet Take 1 tablet by mouth 2 (two) times daily. 11/15/15   Henrene DodgeJose Tyquavious Gamel, MD    Allergies: Allergies  Allergen Reactions  . Codeine Nausea And Vomiting    Social History:  reports that she has been smoking Cigarettes.  She has been smoking about 1.00 pack per day. She has never used smokeless tobacco. She reports that she does not drink alcohol or use drugs.   Family History: Family History  Problem Relation Age of Onset  . Atrial fibrillation Mother   . Heart disease Father   . Hypertension Father   . Asthma Son     Review of Systems: Review of Systems  Constitutional: Negative for chills and fever.  Eyes: Negative for blurred vision.  Respiratory: Negative for cough and shortness of breath.   Cardiovascular: Negative for chest pain and leg swelling.  Gastrointestinal: Positive for abdominal pain, nausea and vomiting. Negative for constipation, diarrhea and heartburn.  Genitourinary: Negative for dysuria.  Musculoskeletal: Negative for myalgias.  Skin: Negative for  rash.  Neurological: Negative for dizziness and headaches.  Psychiatric/Behavioral: Negative for depression.    Physical Exam BP 116/71   Pulse 88   Temp 98.5 F (36.9 C) (Oral)   Ht 5\' 2"  (1.575 m)   Wt 62.1 kg (136 lb 12.8 oz)   LMP 10/16/2015   BMI 25.02 kg/m  CONSTITUTIONAL: no acute distress HEENT:  Normocephalic, atraumatic, extraocular motion intact. NECK: Trachea is midline, and there is no jugular venous distension.  LYMPH  NODES:  Lymph nodes in the neck are not enlarged. RESPIRATORY:  Lungs are clear, and breath sounds are equal bilaterally. Normal respiratory effort without pathologic use of accessory muscles. CARDIOVASCULAR: Heart is regular without murmurs, gallops, or rubs. GI: The abdomen is soft, nondistended, with minimal soreness to palpation over the epigastric and right upper quadrant region. Negative Murphy sign. There were no palpable masses.  MUSCULOSKELETAL:  Normal muscle strength and tone in all four extremities.  No peripheral edema or cyanosis. SKIN: Skin turgor is normal. There are no pathologic skin lesions.  NEUROLOGIC:  Motor and sensation is grossly normal.  Cranial nerves are grossly intact. PSYCH:  Alert and oriented to person, place and time. Affect is normal.   Assessment and Plan: This is a 35 y.o. female who presents with cholelithiasis and a recent elevated WBC of 19.  Currently her pain has improved.  I have discussed with her that she should restrain to a low fat diet in order to decrease the risk for another episode of biliary colic.  Given her WBC of 19, there is concern that she could have had acute cholecystitis, and given that this has been ongoing for 1 week, would rather try antibiotic management for now to continue to cool down the gallbladder before trying surgical management.  Will change the Keflex for Augmentin for better coverage for a 10 day course.  The patient will follow up in 2 weeks at which point, we will set her up for surgery.  If there is no improvement or her pain worsens, the patient understands that she should proceed to the emergency room to be further evaluated by Surgery.  Patient understands this plan and all her questions have been answered.   Howie IllJose Luis Zayla Agar, MD [facility]

## 2015-11-23 ENCOUNTER — Telehealth: Payer: Self-pay

## 2015-11-23 ENCOUNTER — Inpatient Hospital Stay
Admission: EM | Admit: 2015-11-23 | Discharge: 2015-11-25 | DRG: 419 | Disposition: A | Discharge status: Home / Self Care | Payer: Medicaid Other | Attending: Surgery | Admitting: Surgery

## 2015-11-23 ENCOUNTER — Encounter: Payer: Self-pay | Admitting: Emergency Medicine

## 2015-11-23 ENCOUNTER — Emergency Department: Payer: Medicaid Other

## 2015-11-23 DIAGNOSIS — Z79899 Other long term (current) drug therapy: Secondary | ICD-10-CM | POA: Diagnosis not present

## 2015-11-23 DIAGNOSIS — K8000 Calculus of gallbladder with acute cholecystitis without obstruction: Secondary | ICD-10-CM | POA: Diagnosis present

## 2015-11-23 DIAGNOSIS — K8050 Calculus of bile duct without cholangitis or cholecystitis without obstruction: Secondary | ICD-10-CM | POA: Diagnosis present

## 2015-11-23 DIAGNOSIS — Z885 Allergy status to narcotic agent status: Secondary | ICD-10-CM

## 2015-11-23 DIAGNOSIS — F17210 Nicotine dependence, cigarettes, uncomplicated: Secondary | ICD-10-CM | POA: Diagnosis present

## 2015-11-23 DIAGNOSIS — K810 Acute cholecystitis: Secondary | ICD-10-CM

## 2015-11-23 DIAGNOSIS — K219 Gastro-esophageal reflux disease without esophagitis: Secondary | ICD-10-CM | POA: Diagnosis present

## 2015-11-23 DIAGNOSIS — R1010 Upper abdominal pain, unspecified: Secondary | ICD-10-CM

## 2015-11-23 LAB — COMPREHENSIVE METABOLIC PANEL
ALK PHOS: 64 U/L (ref 38–126)
ALT: 14 U/L (ref 14–54)
AST: 15 U/L (ref 15–41)
Albumin: 4.6 g/dL (ref 3.5–5.0)
Anion gap: 6 (ref 5–15)
BUN: 9 mg/dL (ref 6–20)
CALCIUM: 9.8 mg/dL (ref 8.9–10.3)
CHLORIDE: 104 mmol/L (ref 101–111)
CO2: 28 mmol/L (ref 22–32)
CREATININE: 0.62 mg/dL (ref 0.44–1.00)
GFR calc Af Amer: >60 mL/min (ref >60)
Glucose, Bld: 109 mg/dL — ABNORMAL HIGH (ref 65–99)
Potassium: 4.1 mmol/L (ref 3.5–5.1)
SODIUM: 138 mmol/L (ref 135–145)
Total Bilirubin: 0.3 mg/dL (ref 0.3–1.2)
Total Protein: 7.5 g/dL (ref 6.5–8.1)

## 2015-11-23 LAB — CBC
HCT: 45.1 % (ref 35.0–47.0)
Hemoglobin: 15.2 g/dL (ref 12.0–16.0)
MCH: 30.6 pg (ref 26.0–34.0)
MCHC: 33.7 g/dL (ref 32.0–36.0)
MCV: 90.7 fL (ref 80.0–100.0)
Platelets: 234 10*3/uL (ref 150–440)
RBC: 4.97 MIL/uL (ref 3.80–5.20)
RDW: 12.3 % (ref 11.5–14.5)
WBC: 11.8 10*3/uL — ABNORMAL HIGH (ref 3.6–11.0)

## 2015-11-23 LAB — URINE DRUG SCREEN, QUALITATIVE (ARMC ONLY)
AMPHETAMINES, UR SCREEN: NOT DETECTED
BARBITURATES, UR SCREEN: NOT DETECTED
Benzodiazepine, Ur Scrn: NOT DETECTED
COCAINE METABOLITE, UR ~~LOC~~: NOT DETECTED
Cannabinoid 50 Ng, Ur ~~LOC~~: NOT DETECTED
MDMA (ECSTASY) UR SCREEN: NOT DETECTED
METHADONE SCREEN, URINE: NOT DETECTED
OPIATE, UR SCREEN: NOT DETECTED
Phencyclidine (PCP) Ur S: NOT DETECTED
TRICYCLIC, UR SCREEN: NOT DETECTED

## 2015-11-23 LAB — URINALYSIS COMPLETE WITH MICROSCOPIC (ARMC ONLY)
BILIRUBIN URINE: NEGATIVE
GLUCOSE, UA: NEGATIVE mg/dL
Leukocytes, UA: NEGATIVE
Nitrite: NEGATIVE
PH: 6 (ref 5.0–8.0)
Protein, ur: NEGATIVE mg/dL
Specific Gravity, Urine: 1.013 (ref 1.005–1.030)

## 2015-11-23 LAB — POCT PREGNANCY, URINE: Preg Test, Ur: NEGATIVE

## 2015-11-23 LAB — LIPASE, BLOOD: Lipase: 23 U/L (ref 11–51)

## 2015-11-23 MED ORDER — MORPHINE SULFATE (PF) 4 MG/ML IV SOLN
4.0000 mg | Freq: Once | INTRAVENOUS | Status: AC
  Administered 2015-11-23: 4 mg via INTRAVENOUS

## 2015-11-23 MED ORDER — INFLUENZA VAC SPLIT QUAD 0.5 ML IM SUSY: 0.5000 mL | PREFILLED_SYRINGE | INTRAMUSCULAR | Status: DC

## 2015-11-23 MED ORDER — LORATADINE 10 MG PO TABS
10.0000 mg | ORAL_TABLET | Freq: Every day | ORAL | Status: DC
  Administered 2015-11-25: 10 mg via ORAL
  Filled 2015-11-23: qty 1

## 2015-11-23 MED ORDER — SODIUM CHLORIDE 0.9 % IV BOLUS (SEPSIS)
1000.0000 mL | Freq: Once | INTRAVENOUS | Status: AC
  Administered 2015-11-23: 1000 mL via INTRAVENOUS

## 2015-11-23 MED ORDER — ENOXAPARIN SODIUM 40 MG/0.4ML ~~LOC~~ SOLN
40.0000 mg | SUBCUTANEOUS | Status: DC
  Administered 2015-11-23 – 2015-11-24 (×2): 40 mg via SUBCUTANEOUS
  Filled 2015-11-23 (×2): qty 0.4

## 2015-11-23 MED ORDER — KETOROLAC TROMETHAMINE 30 MG/ML IJ SOLN
30.0000 mg | Freq: Four times a day (QID) | INTRAMUSCULAR | Status: DC
  Administered 2015-11-23 – 2015-11-25 (×5): 30 mg via INTRAVENOUS
  Filled 2015-11-23 (×5): qty 1

## 2015-11-23 MED ORDER — POLYETHYLENE GLYCOL 3350 17 G PO PACK: 17.0000 g | PACK | Freq: Every day | ORAL | Status: DC | PRN

## 2015-11-23 MED ORDER — DEXTROSE 5 % IV SOLN
2.0000 g | Freq: Three times a day (TID) | INTRAVENOUS | Status: DC
  Administered 2015-11-24 – 2015-11-25 (×4): 2 g via INTRAVENOUS
  Filled 2015-11-23 (×6): qty 2

## 2015-11-23 MED ORDER — LACTATED RINGERS IV SOLN
INTRAVENOUS | Status: DC
  Administered 2015-11-23 – 2015-11-25 (×4): via INTRAVENOUS

## 2015-11-23 MED ORDER — MORPHINE SULFATE (PF) 4 MG/ML IV SOLN
INTRAVENOUS | Status: AC
  Filled 2015-11-23: qty 1

## 2015-11-23 MED ORDER — HYDROMORPHONE HCL 1 MG/ML IJ SOLN
INTRAMUSCULAR | Status: AC
  Administered 2015-11-23: 1 mg via INTRAVENOUS
  Filled 2015-11-23: qty 1

## 2015-11-23 MED ORDER — ONDANSETRON HCL 4 MG/2ML IJ SOLN
INTRAMUSCULAR | Status: AC
  Filled 2015-11-23: qty 2

## 2015-11-23 MED ORDER — ONDANSETRON HCL 4 MG/2ML IJ SOLN: 4.0000 mg | Freq: Four times a day (QID) | INTRAMUSCULAR | Status: DC | PRN

## 2015-11-23 MED ORDER — HYDROMORPHONE HCL 1 MG/ML IJ SOLN
0.5000 mg | INTRAMUSCULAR | Status: DC | PRN
  Administered 2015-11-23 – 2015-11-24 (×2): 0.5 mg via INTRAVENOUS
  Filled 2015-11-23 (×4): qty 1

## 2015-11-23 MED ORDER — ONDANSETRON 4 MG PO TBDP: 4.0000 mg | ORAL_TABLET | Freq: Four times a day (QID) | ORAL | Status: DC | PRN

## 2015-11-23 MED ORDER — FAMOTIDINE IN NACL 20-0.9 MG/50ML-% IV SOLN
20.0000 mg | Freq: Two times a day (BID) | INTRAVENOUS | Status: DC
  Administered 2015-11-23: 20 mg via INTRAVENOUS
  Filled 2015-11-23 (×3): qty 50

## 2015-11-23 MED ORDER — FLUTICASONE PROPIONATE 50 MCG/ACT NA SUSP
2.0000 | Freq: Every day | NASAL | Status: DC
  Filled 2015-11-23: qty 16

## 2015-11-23 MED ORDER — ONDANSETRON HCL 4 MG/2ML IJ SOLN
4.0000 mg | Freq: Once | INTRAMUSCULAR | Status: AC
  Administered 2015-11-23: 4 mg via INTRAVENOUS

## 2015-11-23 MED ORDER — ACETAMINOPHEN 500 MG PO TABS
1000.0000 mg | ORAL_TABLET | Freq: Four times a day (QID) | ORAL | Status: DC
  Administered 2015-11-23: 1000 mg via ORAL
  Filled 2015-11-23 (×2): qty 2

## 2015-11-23 MED ORDER — HYDROMORPHONE HCL 1 MG/ML IJ SOLN
1.0000 mg | Freq: Once | INTRAMUSCULAR | Status: AC
  Administered 2015-11-23: 1 mg via INTRAVENOUS

## 2015-11-23 NOTE — ED Provider Notes (Signed)
[facility] Emergency Department Provider Note  Time seen: 8:42 PM  I have reviewed the triage vital signs and the nursing notes.   HISTORY  Chief Complaint Abdominal Pain    HPI Amy Marshall is a 35 y.o. female with a past medical history of cholelithiasis, gastric reflux, who presents the emergency department for abdominal pain. According to the patient she developed epigastric/right upper quadrant abdominal pain around 5 PM tonight. States she had eaten pizza an hour before. Patient has been seen 3 times over the past several weeks for similar abdominal pain. Was found to have gallstones, with a diagnosis of likely biliary colic. Patient was seen by general surgery 11/15/15, was placed on antibiotics for the next 2 weeks with a plan to take the gallbladder out later this month. Patient states this is the worst episode of pain she has had yet so she came to the emergency department for evaluation. Denies any fever. Denies dysuria. Patient has been nauseated with several episodes of vomiting this evening.  Past Medical History:  Diagnosis Date  . Allergy   . Cholelithiasis   . GERD (gastroesophageal reflux disease)     Patient Active Problem List   Diagnosis Date Noted  . Dyspepsia 10/11/2015    Past Surgical History:  Procedure Laterality Date  . NO PAST SURGERIES      Prior to Admission medications   Medication Sig Start Date End Date Taking? Authorizing Provider  amoxicillin-clavulanate (AUGMENTIN) 875-125 MG tablet Take 1 tablet by mouth 2 (two) times daily. 11/15/15   Henrene DodgeJose Piscoya, MD  fluticasone (FLONASE) 50 MCG/ACT nasal spray Place 2 sprays into both nostrils daily. 09/13/15   Jami L Hagler, PA-C  loratadine (CLARITIN) 10 MG tablet Take 1 tablet (10 mg total) by mouth daily. 09/13/15   Jami L Hagler, PA-C  mupirocin ointment (BACTROBAN) 2 % APP EXT AA TID 11/07/15   Historical Provider, MD  ondansetron (ZOFRAN ODT) 4 MG disintegrating tablet  Take 1 tablet (4 mg total) by mouth every 8 (eight) hours as needed for nausea or vomiting. 11/13/15   Irean HongJade J Sung, MD  oxyCODONE-acetaminophen (PERCOCET/ROXICET) 5-325 MG tablet TK 1 T PO Q 4 H PRF SEVERE PAIN 11/09/15   Historical Provider, MD  pantoprazole (PROTONIX) 40 MG tablet Take by mouth.    Historical Provider, MD  sucralfate (CARAFATE) 1 g tablet Take by mouth.    Historical Provider, MD  triamcinolone cream (KENALOG) 0.1 % APP EXT AA BID FOR UP TO 2 WKS 11/07/15   Historical Provider, MD    Allergies  Allergen Reactions  . Codeine Nausea And Vomiting    Family History  Problem Relation Age of Onset  . Atrial fibrillation Mother   . Heart disease Father   . Hypertension Father   . Asthma Son     Social History Social History  Substance Use Topics  . Smoking status: Current Every Day Smoker    Packs/day: 1.00    Types: Cigarettes  . Smokeless tobacco: Never Used  . Alcohol use No    Review of Systems Constitutional: Negative for fever. Cardiovascular: Negative for chest pain. Respiratory: Negative for shortness of breath. Gastrointestinal: Upper abdominal pain. Genitourinary: Negative for dysuria. Neurological: Negative for headache 10-point ROS otherwise negative.  ____________________________________________   PHYSICAL EXAM:  VITAL SIGNS: ED Triage Vitals  Enc Vitals Group     BP 11/23/15 1858 138/86     Pulse Rate 11/23/15 1858 96     Resp 11/23/15 1858 17  Temp 11/23/15 1858 98 F (36.7 C)     Temp Source 11/23/15 1858 Oral     SpO2 11/23/15 1858 98 %     Weight 11/23/15 1859 136 lb (61.7 kg)     Height 11/23/15 1859 5\' 2"  (1.575 m)     Head Circumference --      Peak Flow --      Pain Score 11/23/15 1951 8     Pain Loc --      Pain Edu? --      Excl. in GC? --     Constitutional: Alert and oriented. Moderate distress rolling around on bed stating she is in pain. Eyes: Normal exam ENT   Head: Normocephalic and atraumatic.    Mouth/Throat: Mucous membranes are moist. Cardiovascular: Normal rate, regular rhythm. No murmur Respiratory: Normal respiratory effort without tachypnea nor retractions. Breath sounds are clear  Gastrointestinal: Soft, mild diffuse abdominal tenderness palpation with moderate epigastric right upper quadrant tenderness. Musculoskeletal: Nontender with normal range of motion in all extremities.  Neurologic:  Normal speech and language. No gross focal neurologic deficits Skin:  Skin is warm, dry and intact.    ____________________________________________     RADIOLOGY  Ultrasound shows a nonmobile stone in the neck of the gallbladder with possible gallbladder wall thickening.  ____________________________________________   INITIAL IMPRESSION / ASSESSMENT AND PLAN / ED COURSE  Pertinent labs & imaging results that were available during my care of the patient were reviewed by me and considered in my medical decision making (see chart for details).  Patient presents for upper abdominal pain that started at 5 PM tonight. Patient appears to be in considerable pain at this time. We will check labs, obtain a repeat right upper quadrant ultrasound to further evaluate. We will treat pain and nausea, IV hydrate while awaiting lab and ultrasound results. Patient agreeable.  Ultrasound shows a nonmobile stone in the neck of the gallbladder, possibly cholecystitis. Given the patient's continued discomfort despite 2 rounds of pain medication I discussed the patient on general surgery will be down to see and evaluate the patient.  ____________________________________________   FINAL CLINICAL IMPRESSION(S) / ED DIAGNOSES  Abdominal pain Cholecystitis   Minna AntisKevin Crews Mccollam, MD 11/23/15 2209

## 2015-11-23 NOTE — ED Notes (Signed)
AAOx3.  Skin warm and dry.  C/o RUQ abdominal pain. STates ate a piece of pizza today and pain returned. Patient states she has known Gallbladder disease.

## 2015-11-23 NOTE — Telephone Encounter (Signed)
Patient states that she is having RUQ pain after eating pizza today and pain has not let up. She also complains of Nausea, denies vomiting. Denies fever/chills. But states pain has increased significantly the last 45 minutes. Placed on schedule for first thing tomorrow morning to be seen by surgeon but explained that if pain increases to severe or symptoms persist, she would need to be seen in ED over night. Patient verbalizes understanding of this.

## 2015-11-23 NOTE — ED Triage Notes (Addendum)
Patient ambulatory to triage with steady gait, without difficulty or distress noted; pt reports right sided abd pain with dx of gallstones; st 3rd visit here for same and was told by surgeon to come here for further eval; st out of her percocet 10

## 2015-11-23 NOTE — H&P (Signed)
Date of Admission:  11/23/2015  Reason for Admission:  Acute cholecystitis  History of Present Illness: Amy Marshall is a 35 y.o. female who presents with a 6-hour history of RUQ abdominal pain.  She had been seen twice before in the emergency room for cholelithiasis and I had seen her in the office on 11/1, at which time the plan was for 10-day course of Augmentin for suspected cholecystitis on her last ED visit on 10/30, with follow up next week for scheduling for surgery.  Patient reports that over the past 9 days, she has had two minor episodes of pain which were not significant, but this afternoon around 4 pm, developed intense RUQ pain.  She had pizza for lunch.  Her pain does not radiate and is concentrated in the epigastric and RUQ regions.  Has had nausea and emesis.  Denies any fevers, chills, chest pain, shortness of breath, other areas of pain, constipation, diarrhea, dysuria, hematuria, or blood in the stools.  Past Medical History: Past Medical History:  Diagnosis Date  . Allergy   . Cholelithiasis   . GERD (gastroesophageal reflux disease)      Past Surgical History: Past Surgical History:  Procedure Laterality Date  . NO PAST SURGERIES      Home Medications: Prior to Admission medications   Medication Sig Start Date End Date Taking? Authorizing Provider  amoxicillin-clavulanate (AUGMENTIN) 875-125 MG tablet Take 1 tablet by mouth 2 (two) times daily. 11/15/15   Henrene DodgeJose Jodye Scali, MD  fluticasone (FLONASE) 50 MCG/ACT nasal spray Place 2 sprays into both nostrils daily. 09/13/15   Jami L Hagler, PA-C  loratadine (CLARITIN) 10 MG tablet Take 1 tablet (10 mg total) by mouth daily. 09/13/15   Jami L Hagler, PA-C  mupirocin ointment (BACTROBAN) 2 % APP EXT AA TID 11/07/15   Historical Provider, MD  ondansetron (ZOFRAN ODT) 4 MG disintegrating tablet Take 1 tablet (4 mg total) by mouth every 8 (eight) hours as needed for nausea or vomiting. 11/13/15   Irean HongJade J Sung, MD   oxyCODONE-acetaminophen (PERCOCET/ROXICET) 5-325 MG tablet TK 1 T PO Q 4 H PRF SEVERE PAIN 11/09/15   Historical Provider, MD  pantoprazole (PROTONIX) 40 MG tablet Take by mouth.    Historical Provider, MD  sucralfate (CARAFATE) 1 g tablet Take by mouth.    Historical Provider, MD  triamcinolone cream (KENALOG) 0.1 % APP EXT AA BID FOR UP TO 2 WKS 11/07/15   Historical Provider, MD    Allergies: Allergies  Allergen Reactions  . Codeine Nausea And Vomiting    Social History:  reports that she has been smoking Cigarettes.  She has been smoking about 1.00 pack per day. She has never used smokeless tobacco. She reports that she does not drink alcohol or use drugs.   Family History: Family History  Problem Relation Age of Onset  . Atrial fibrillation Mother   . Heart disease Father   . Hypertension Father   . Asthma Son     Review of Systems: Review of Systems  Constitutional: Negative for chills and fever.  HENT: Negative for hearing loss.   Eyes: Negative for blurred vision.  Respiratory: Negative for cough and shortness of breath.   Cardiovascular: Negative for chest pain and leg swelling.  Gastrointestinal: Positive for abdominal pain, nausea and vomiting. Negative for blood in stool, constipation, diarrhea and heartburn.  Genitourinary: Negative for dysuria and hematuria.  Musculoskeletal: Negative for myalgias.  Skin: Negative for rash.  Neurological: Negative for dizziness.  Psychiatric/Behavioral: Negative  for depression.  All other systems reviewed and are negative.   Physical Exam BP 138/86 (BP Location: Left Arm)   Pulse 96   Temp 98 F (36.7 C) (Oral)   Resp 17   Ht 5\' 2"  (1.575 m)   Wt 61.7 kg (136 lb)   LMP 11/19/2015 (Exact Date)   SpO2 98%   BMI 24.87 kg/m  CONSTITUTIONAL: Appears in some pain distress. HEENT:  Normocephalic, atraumatic, extraocular motion intact. NECK: Trachea is midline, and there is no jugular venous distension.  LYMPH NODES:   Lymph nodes in the neck are not enlarged. RESPIRATORY:  Lungs are clear, and breath sounds are equal bilaterally. Normal respiratory effort without pathologic use of accessory muscles. CARDIOVASCULAR: Heart is regular without murmurs, gallops, or rubs. GI: The abdomen is soft, non-distended, with tenderness to palpation over epigastric and RUQ regions.  Positive Murphy's sign. MUSCULOSKELETAL:  Normal muscle strength and tone in all four extremities.  No peripheral edema or cyanosis. SKIN: Skin turgor is normal. There are no pathologic skin lesions.  NEUROLOGIC:  Motor and sensation is grossly normal.  Cranial nerves are grossly intact. PSYCH:  Alert and oriented to person, place and time. Affect is normal.  Laboratory Analysis: Results for orders placed or performed during the hospital encounter of 11/23/15 (from the past 24 hour(s))  Lipase, blood     Status: None   Collection Time: 11/23/15  7:09 PM  Result Value Ref Range   Lipase 23 11 - 51 U/L  Comprehensive metabolic panel     Status: Abnormal   Collection Time: 11/23/15  7:09 PM  Result Value Ref Range   Sodium 138 135 - 145 mmol/L   Potassium 4.1 3.5 - 5.1 mmol/L   Chloride 104 101 - 111 mmol/L   CO2 28 22 - 32 mmol/L   Glucose, Bld 109 (H) 65 - 99 mg/dL   BUN 9 6 - 20 mg/dL   Creatinine, Ser 5.620.62 0.44 - 1.00 mg/dL   Calcium 9.8 8.9 - 13.010.3 mg/dL   Total Protein 7.5 6.5 - 8.1 g/dL   Albumin 4.6 3.5 - 5.0 g/dL   AST 15 15 - 41 U/L   ALT 14 14 - 54 U/L   Alkaline Phosphatase 64 38 - 126 U/L   Total Bilirubin 0.3 0.3 - 1.2 mg/dL   GFR calc non Af Amer >60 >60 mL/min   GFR calc Af Amer >60 >60 mL/min   Anion gap 6 5 - 15  CBC     Status: Abnormal   Collection Time: 11/23/15  7:09 PM  Result Value Ref Range   WBC 11.8 (H) 3.6 - 11.0 K/uL   RBC 4.97 3.80 - 5.20 MIL/uL   Hemoglobin 15.2 12.0 - 16.0 g/dL   HCT 86.545.1 78.435.0 - 69.647.0 %   MCV 90.7 80.0 - 100.0 fL   MCH 30.6 26.0 - 34.0 pg   MCHC 33.7 32.0 - 36.0 g/dL   RDW 29.512.3  28.411.5 - 13.214.5 %   Platelets 234 150 - 440 K/uL  Urinalysis complete, with microscopic     Status: Abnormal   Collection Time: 11/23/15  7:09 PM  Result Value Ref Range   Color, Urine YELLOW (A) YELLOW   APPearance CLEAR (A) CLEAR   Glucose, UA NEGATIVE NEGATIVE mg/dL   Bilirubin Urine NEGATIVE NEGATIVE   Ketones, ur TRACE (A) NEGATIVE mg/dL   Specific Gravity, Urine 1.013 1.005 - 1.030   Hgb urine dipstick 1+ (A) NEGATIVE   pH 6.0 5.0 -  8.0   Protein, ur NEGATIVE NEGATIVE mg/dL   Nitrite NEGATIVE NEGATIVE   Leukocytes, UA NEGATIVE NEGATIVE   RBC / HPF 0-5 0 - 5 RBC/hpf   WBC, UA 0-5 0 - 5 WBC/hpf   Bacteria, UA RARE (A) NONE SEEN   Squamous Epithelial / LPF 6-30 (A) NONE SEEN   Mucous PRESENT   Urine Drug Screen, Qualitative ([facility] only)     Status: None   Collection Time: 11/23/15  7:09 PM  Result Value Ref Range   Tricyclic, Ur Screen NONE DETECTED NONE DETECTED   Amphetamines, Ur Screen NONE DETECTED NONE DETECTED   MDMA (Ecstasy)Ur Screen NONE DETECTED NONE DETECTED   Cocaine Metabolite,Ur [place] NONE DETECTED NONE DETECTED   Opiate, Ur Screen NONE DETECTED NONE DETECTED   Phencyclidine (PCP) Ur S NONE DETECTED NONE DETECTED   Cannabinoid 50 Ng, Ur [place] NONE DETECTED NONE DETECTED   Barbiturates, Ur Screen NONE DETECTED NONE DETECTED   Benzodiazepine, Ur Scrn NONE DETECTED NONE DETECTED   Methadone Scn, Ur NONE DETECTED NONE DETECTED  Pregnancy, urine POC     Status: None   Collection Time: 11/23/15  7:23 PM  Result Value Ref Range   Preg Test, Ur NEGATIVE NEGATIVE    Imaging: US Abdomen Limited Ruq  Result Date: 11/23/2015 CLINICAL DATA:  35 year old female with right upper quadrant pain for weeks. Pain is become worse tonight. Subsequent encounter. EXAM: US ABDOMEN LIMITED - RIGHT UPPER QUADRANT COMPARISON:  11/09/2015 ultrasound. FINDINGS: Gallbladder: Multiple gallstones. One gallstone appears lodged in the gallbladder neck. Gallbladder wall slightly thickened measuring up  to 4.1 mm. No pericholecystic fluid. Ultrasound sonographer did not comment on whether there was pain during scanning and is presently unavailable. Common bile duct: Diameter: 4.1 mm proximally and 4.2 mm mid aspect. Distal aspect not visualized. Liver: No focal lesion identified. Within normal limits in parenchymal echogenicity. IMPRESSION: Multiple gallstones. One gallstone appears lodged in the gallbladder neck. Gallbladder wall slightly thickened measuring up to 4.1 mm. This gallbladder wall thickening could be related to cholecystitis in proper clinical setting as versus related to mild contraction. No proximal or mid common bile duct stone noted. Distal common bile duct not visualized secondary to bowel gas. Electronically Signed   By: Lacy Duverney M.D.   On: 11/23/2015 21:53    Assessment and Plan: This is a 35 y.o. female who presents with acute cholecystitis.  I have personally reviewed all of her laboratory and imaging studies and discussed them with the patient.  She will be admitted to the General Surgery service.  She will be NPO with IV fluid hydration and appropriate pain and nausea control.  She will be started on IV Cefoxitin for her cholecystitis.  She will be added on to the schedule for laparoscopic cholecystectomy tomorrow.  Risks and benefits of the procedure, including risk of bleeding, infection, injury to surrounding structures, need to convert to open procedure were explained to the patient and she has given informed consent.  I will discuss the patient with Dr. Orvis Brill in the morning, who is the surgeon on call for the daytime.   Howie Ill, MD [facility]

## 2015-11-24 ENCOUNTER — Ambulatory Visit: Payer: Self-pay | Admitting: Surgery

## 2015-11-24 ENCOUNTER — Inpatient Hospital Stay: Payer: Medicaid Other | Admitting: Registered Nurse

## 2015-11-24 ENCOUNTER — Encounter
Admission: EM | Disposition: A | Discharge status: Home / Self Care | Payer: Self-pay | Source: Home / Self Care | Attending: Surgery

## 2015-11-24 DIAGNOSIS — K810 Acute cholecystitis: Secondary | ICD-10-CM

## 2015-11-24 HISTORY — PX: CHOLECYSTECTOMY: SHX55

## 2015-11-24 LAB — SURGICAL PCR SCREEN
MRSA, PCR: NEGATIVE
Staphylococcus aureus: NEGATIVE

## 2015-11-24 SURGERY — LAPAROSCOPIC CHOLECYSTECTOMY
Anesthesia: General

## 2015-11-24 MED ORDER — PROPOFOL 10 MG/ML IV BOLUS
INTRAVENOUS | Status: DC | PRN
  Administered 2015-11-24: 150 mg via INTRAVENOUS

## 2015-11-24 MED ORDER — FENTANYL CITRATE (PF) 100 MCG/2ML IJ SOLN
INTRAMUSCULAR | Status: DC | PRN
  Administered 2015-11-24 (×5): 50 ug via INTRAVENOUS

## 2015-11-24 MED ORDER — SUGAMMADEX SODIUM 200 MG/2ML IV SOLN
INTRAVENOUS | Status: DC | PRN
  Administered 2015-11-24: 125 mg via INTRAVENOUS

## 2015-11-24 MED ORDER — FENTANYL CITRATE (PF) 100 MCG/2ML IJ SOLN
25.0000 ug | INTRAMUSCULAR | Status: DC | PRN
  Administered 2015-11-24 (×4): 25 ug via INTRAVENOUS

## 2015-11-24 MED ORDER — ACETAMINOPHEN 10 MG/ML IV SOLN
INTRAVENOUS | Status: AC
  Filled 2015-11-24: qty 100

## 2015-11-24 MED ORDER — LIDOCAINE HCL (CARDIAC) 20 MG/ML IV SOLN
INTRAVENOUS | Status: DC | PRN
  Administered 2015-11-24: 60 mg via INTRAVENOUS

## 2015-11-24 MED ORDER — BUPIVACAINE HCL (PF) 0.5 % IJ SOLN
INTRAMUSCULAR | Status: DC | PRN
  Administered 2015-11-24: 8 mL
  Administered 2015-11-24: 22 mL

## 2015-11-24 MED ORDER — CYCLOBENZAPRINE HCL 10 MG PO TABS
10.0000 mg | ORAL_TABLET | Freq: Three times a day (TID) | ORAL | Status: DC
  Administered 2015-11-24 – 2015-11-25 (×3): 10 mg via ORAL
  Filled 2015-11-24 (×3): qty 1

## 2015-11-24 MED ORDER — ONDANSETRON HCL 4 MG/2ML IJ SOLN
INTRAMUSCULAR | Status: DC | PRN
  Administered 2015-11-24: 4 mg via INTRAVENOUS

## 2015-11-24 MED ORDER — ROCURONIUM BROMIDE 100 MG/10ML IV SOLN
INTRAVENOUS | Status: DC | PRN
  Administered 2015-11-24: 10 mg via INTRAVENOUS
  Administered 2015-11-24: 30 mg via INTRAVENOUS

## 2015-11-24 MED ORDER — OXYCODONE HCL 5 MG PO TABS: 5.0000 mg | ORAL_TABLET | ORAL | Status: DC | PRN

## 2015-11-24 MED ORDER — ESMOLOL HCL 100 MG/10ML IV SOLN
INTRAVENOUS | Status: DC | PRN
  Administered 2015-11-24 (×2): 10 mg via INTRAVENOUS

## 2015-11-24 MED ORDER — FENTANYL CITRATE (PF) 100 MCG/2ML IJ SOLN
INTRAMUSCULAR | Status: AC
  Administered 2015-11-24: 25 ug via INTRAVENOUS
  Filled 2015-11-24: qty 2

## 2015-11-24 MED ORDER — KETOROLAC TROMETHAMINE 30 MG/ML IJ SOLN
INTRAMUSCULAR | Status: DC | PRN
  Administered 2015-11-24: 30 mg via INTRAVENOUS

## 2015-11-24 MED ORDER — LACTATED RINGERS IV SOLN
INTRAVENOUS | Status: DC | PRN
  Administered 2015-11-24: 11:00:00 via INTRAVENOUS

## 2015-11-24 MED ORDER — ACETAMINOPHEN 325 MG PO TABS
650.0000 mg | ORAL_TABLET | Freq: Four times a day (QID) | ORAL | Status: DC
  Administered 2015-11-24 – 2015-11-25 (×4): 650 mg via ORAL
  Filled 2015-11-24 (×4): qty 2

## 2015-11-24 MED ORDER — CEFOXITIN SODIUM-DEXTROSE 2-2.2 GM-% IV SOLR (PREMIX): 2.0000 g | Freq: Once | INTRAVENOUS | Status: DC

## 2015-11-24 MED ORDER — ONDANSETRON HCL 4 MG/2ML IJ SOLN: 4.0000 mg | Freq: Once | INTRAMUSCULAR | Status: DC | PRN

## 2015-11-24 MED ORDER — DEXAMETHASONE SODIUM PHOSPHATE 10 MG/ML IJ SOLN
INTRAMUSCULAR | Status: DC | PRN
  Administered 2015-11-24: 10 mg via INTRAVENOUS

## 2015-11-24 MED ORDER — BUPIVACAINE HCL (PF) 0.5 % IJ SOLN
INTRAMUSCULAR | Status: AC
  Filled 2015-11-24: qty 30

## 2015-11-24 MED ORDER — HYDROMORPHONE HCL 1 MG/ML IJ SOLN
0.5000 mg | INTRAMUSCULAR | Status: DC | PRN
  Administered 2015-11-24: 0.5 mg via INTRAVENOUS

## 2015-11-24 MED ORDER — MIDAZOLAM HCL 2 MG/2ML IJ SOLN
INTRAMUSCULAR | Status: DC | PRN
Start: 2015-11-24 — End: 2015-11-24
  Administered 2015-11-24: 2 mg via INTRAVENOUS

## 2015-11-24 MED ORDER — ACETAMINOPHEN 10 MG/ML IV SOLN
INTRAVENOUS | Status: DC | PRN
  Administered 2015-11-24: 1000 mg via INTRAVENOUS

## 2015-11-24 MED ORDER — CEFOXITIN SODIUM-DEXTROSE 2-2.2 GM-% IV SOLR (PREMIX)
INTRAVENOUS | Status: DC
Start: 2015-11-24 — End: 2015-11-24
  Filled 2015-11-24: qty 50

## 2015-11-24 SURGICAL SUPPLY — 40 items
APPLIER CLIP 5 13 M/L LIGAMAX5 (MISCELLANEOUS)
BLADE SURG 15 STRL LF DISP TIS (BLADE) ×1 IMPLANT
BLADE SURG 15 STRL SS (BLADE) ×1
CANISTER SUCT 1200ML W/VALVE (MISCELLANEOUS) ×2 IMPLANT
CATH CHOLANGI 4FR 420404F (CATHETERS) IMPLANT
CHLORAPREP W/TINT 26ML (MISCELLANEOUS) ×2 IMPLANT
CLIP APPLIE 5 13 M/L LIGAMAX5 (MISCELLANEOUS) IMPLANT
CONRAY 60ML FOR OR (MISCELLANEOUS) IMPLANT
DEFOGGER SCOPE WARMER CLEARIFY (MISCELLANEOUS) ×2 IMPLANT
DRAPE SHEET LG 3/4 BI-LAMINATE (DRAPES) ×2 IMPLANT
ELECT CAUTERY BLADE 6.4 (BLADE) ×2 IMPLANT
ELECT E-Z MONOPOLAR 33 (MISCELLANEOUS)
ELECT REM PT RETURN 9FT ADLT (ELECTROSURGICAL) ×2
ELECTRODE E-Z MONOPOLAR 33 (MISCELLANEOUS) IMPLANT
ELECTRODE REM PT RTRN 9FT ADLT (ELECTROSURGICAL) ×1 IMPLANT
ENDOPOUCH RETRIEVER 10 (MISCELLANEOUS) ×2 IMPLANT
GLOVE PI ORTHOPRO 6.5 (GLOVE) ×1
GLOVE PI ORTHOPRO STRL 6.5 (GLOVE) ×1 IMPLANT
GOWN STRL REUS W/ TWL LRG LVL3 (GOWN DISPOSABLE) ×3 IMPLANT
GOWN STRL REUS W/TWL LRG LVL3 (GOWN DISPOSABLE) ×3
IRRIGATION STRYKERFLOW (MISCELLANEOUS) ×1 IMPLANT
IRRIGATOR STRYKERFLOW (MISCELLANEOUS) ×2
IV CATH ANGIO 12GX3 LT BLUE (NEEDLE) ×2 IMPLANT
IV NS 1000ML (IV SOLUTION) ×1
IV NS 1000ML BAXH (IV SOLUTION) ×1 IMPLANT
LABEL OR SOLS (LABEL) ×2 IMPLANT
LIQUID BAND (GAUZE/BANDAGES/DRESSINGS) ×2 IMPLANT
NEEDLE HYPO 25X1 1.5 SAFETY (NEEDLE) ×2 IMPLANT
NS IRRIG 500ML POUR BTL (IV SOLUTION) ×2 IMPLANT
PACK LAP CHOLECYSTECTOMY (MISCELLANEOUS) ×2 IMPLANT
PENCIL ELECTRO HAND CTR (MISCELLANEOUS) ×2 IMPLANT
SCISSORS METZENBAUM CVD 33 (INSTRUMENTS) ×2 IMPLANT
SLEEVE ENDOPATH XCEL 5M (ENDOMECHANICALS) ×4 IMPLANT
SUT MNCRL 4-0 (SUTURE) ×1
SUT MNCRL 4-0 27XMFL (SUTURE) ×1
SUT VICRYL 0 AB UR-6 (SUTURE) ×4 IMPLANT
SUTURE MNCRL 4-0 27XMF (SUTURE) ×1 IMPLANT
TROCAR XCEL BLUNT TIP 100MML (ENDOMECHANICALS) ×2 IMPLANT
TROCAR XCEL NON-BLD 5MMX100MML (ENDOMECHANICALS) ×2 IMPLANT
TUBING INSUFFLATOR HI FLOW (MISCELLANEOUS) ×2 IMPLANT

## 2015-11-24 NOTE — Op Note (Signed)
Laparoscopic Cholecystectomy  Procedure Note  Indications: This patient presents with symptomatic gallbladder disease and will undergo laparoscopic cholecystectomy.  Pre-operative Diagnosis: Calculus of gallbladder with acute cholecystitis, without mention of obstruction  Post-operative Diagnosis: Same  Surgeon: Gladis Riffleatherine L Yariah Selvey   Assistants: none  Anesthesia: General endotracheal anesthesia  ASA Class: 2  Procedure Details  The patient was seen again in the Holding Room. The risks, benefits, complications, treatment options, and expected outcomes were discussed with the patient. The possibilities of reaction to medication, pulmonary aspiration, perforation of viscus, bleeding, recurrent infection, finding a normal gallbladder, the need for additional procedures, failure to diagnose a condition, the possible need to convert to an open procedure, and creating a complication requiring transfusion or operation were discussed with the patient. The patient and/or family concurred with the proposed plan, giving informed consent. The site of surgery properly noted/marked. The patient was taken to Operating Room, identified as Amy Marshall and the procedure verified as Laparoscopic Cholecystectomy.  A Time Out was held and the above information confirmed.  Prior to the induction of general anesthesia, antibiotic prophylaxis was administered. General endotracheal anesthesia was then administered and tolerated well. After the induction, the abdomen was prepped in the usual sterile fashion. The patient was positioned in the supine position with the left arm comfortably tucked, along with some reverse Trendelenburg.  Local anesthetic agent was injected into the skin near the umbilicus and an incision made. The midline fascia was incised and the Hasson technique was used to introduce a 10 mm port under direct vision. It was secured with a figure of eight Vicryl suture placed in the usual fashion.  Pneumoperitoneum was then created with CO2 and tolerated well without any adverse changes in the patient's vital signs. Additional trocars were introduced under direct vision. All skin incisions were infiltrated with a local anesthetic agent before making the incision and placing the trocars.   The gallbladder was identified, the fundus grasped and retracted cephalad. Adhesions were lysed bluntly and with the electrocautery where indicated, taking care not to injure any adjacent organs or viscus. The infundibulum was grasped and retracted laterally, exposing the peritoneum overlying the triangle of Calot. This was then divided and exposed in a blunt fashion. The cystic duct was clearly identified and bluntly dissected circumferentially. The junctions of the gallbladder, cystic duct and common bile duct were clearly identified prior to the division of any linear structure.   The cystic duct was then doubly ligated with surgical clips on the patient side and singly clipped on the gallbladder side and divided. The cystic artery was identified, dissected free, ligated with clips and divided as well.   The gallbladder was dissected from the liver bed in retrograde fashion with the electrocautery. The gallbladder was removed. The liver bed was irrigated and inspected. Hemostasis was achieved with the electrocautery. Copious irrigation was utilized and was repeatedly aspirated until clear all particulate matter.  Pneumoperitoneum was completely reduced after viewing removal of the trocars under direct vision. The wound was thoroughly irrigated and the fascia was then closed with a figure of eight suture; the skin was then closed with 4-0 Monocryl and a sterile glue was applied. She had a small burn in the skin above the umbilicus and sterile glue was applied over that as well.   Instrument, sponge, and needle counts were correct at closure and at the conclusion of the case.   Findings: Cholecystitis with  Cholelithiasis  Estimated Blood Loss: less than 50 mL  Drains: none         Total IV Fluids: 1053mL         Specimens: Gallbladder           Complications: None; patient tolerated the procedure well.         Disposition: PACU - hemodynamically stable.         Condition: stable

## 2015-11-24 NOTE — Anesthesia Procedure Notes (Signed)
Procedure Name: Intubation Date/Time: 11/24/2015 10:36 AM Performed by: Karoline CaldwellSTARR, Lani Havlik Pre-anesthesia Checklist: Patient identified, Emergency Drugs available, Suction available and Patient being monitored Patient Re-evaluated:Patient Re-evaluated prior to inductionOxygen Delivery Method: Circle system utilized Preoxygenation: Pre-oxygenation with 100% oxygen Intubation Type: IV induction Ventilation: Mask ventilation without difficulty Laryngoscope Size: Mac and 3 Grade View: Grade I Tube type: Oral Tube size: 7.0 mm Number of attempts: 1 Airway Equipment and Method: Stylet Placement Confirmation: ETT inserted through vocal cords under direct vision,  positive ETCO2 and breath sounds checked- equal and bilateral Secured at: 20 cm Tube secured with: Tape Dental Injury: Teeth and Oropharynx as per pre-operative assessment

## 2015-11-24 NOTE — Transfer of Care (Signed)
Immediate Anesthesia Transfer of Care Note  Patient: Barbette OrElizabeth Winebarger  Procedure(s) Performed: Procedure(s): LAPAROSCOPIC CHOLECYSTECTOMY (N/A)  Patient Location: PACU  Anesthesia Type:General  Level of Consciousness: awake, alert  and oriented  Airway & Oxygen Therapy: Patient Spontanous Breathing  Post-op Assessment: Report given to RN and Post -op Vital signs reviewed and stable  Post vital signs: Reviewed and stable  Last Vitals:  Vitals:   11/24/15 0818 11/24/15 0931  BP: (!) 100/58 111/72  Pulse: 67 76  Resp: 16 18  Temp: 36.6 C 36.4 C    Last Pain:  Vitals:   11/24/15 0931  TempSrc: Tympanic  PainSc:          Complications: No apparent anesthesia complications

## 2015-11-24 NOTE — Interval H&P Note (Signed)
History and Physical Interval Note:  11/24/2015 10:21 AM  Amy Marshall  has presented today for surgery, with the diagnosis of  acute cholecystitis  The various methods of treatment have been discussed with the patient and family. After consideration of risks, benefits and other options for treatment, the patient has consented to  Procedure(s): LAPAROSCOPIC CHOLECYSTECTOMY (N/A) as a surgical intervention .  The patient's history has been reviewed, patient examined, no change in status, stable for surgery.  I have reviewed the patient's chart and labs.  Questions were answered to the patient's satisfaction.     Sohana Austell L Hamza Empson

## 2015-11-24 NOTE — Anesthesia Postprocedure Evaluation (Signed)
Anesthesia Post Note  Patient: Amy Marshall  Procedure(s) Performed: Procedure(s) (LRB): LAPAROSCOPIC CHOLECYSTECTOMY (N/A)  Patient location during evaluation: PACU Anesthesia Type: General Level of consciousness: awake and alert Pain management: pain level controlled Vital Signs Assessment: post-procedure vital signs reviewed and stable Respiratory status: spontaneous breathing and respiratory function stable Cardiovascular status: stable Anesthetic complications: no    Last Vitals:  Vitals:   11/24/15 1243 11/24/15 1255  BP: (!) 107/91   Pulse: 81 76  Resp: 15 13  Temp:  36.7 C    Last Pain:  Vitals:   11/24/15 1255  TempSrc:   PainSc: 3                  Nashae Maudlin K

## 2015-11-24 NOTE — Anesthesia Preprocedure Evaluation (Signed)
Anesthesia Evaluation  Patient identified by MRN, date of birth, ID band Patient awake    Reviewed: Allergy & Precautions, NPO status , Patient's Chart, lab work & pertinent test results  History of Anesthesia Complications Negative for: history of anesthetic complications  Airway Mallampati: II       Dental   Pulmonary Current Smoker,           Cardiovascular negative cardio ROS       Neuro/Psych negative neurological ROS     GI/Hepatic Neg liver ROS, GERD  Medicated,  Endo/Other  negative endocrine ROS  Renal/GU negative Renal ROS     Musculoskeletal   Abdominal   Peds  Hematology negative hematology ROS (+)   Anesthesia Other Findings   Reproductive/Obstetrics                             Anesthesia Physical Anesthesia Plan  ASA: II  Anesthesia Plan: General   Post-op Pain Management:    Induction: Intravenous  Airway Management Planned: Oral ETT  Additional Equipment:   Intra-op Plan:   Post-operative Plan:   Informed Consent: I have reviewed the patients History and Physical, chart, labs and discussed the procedure including the risks, benefits and alternatives for the proposed anesthesia with the patient or authorized representative who has indicated his/her understanding and acceptance.     Plan Discussed with:   Anesthesia Plan Comments:         Anesthesia Quick Evaluation

## 2015-11-24 NOTE — Progress Notes (Signed)
Initial Nutrition Assessment  DOCUMENTATION CODES:   Not applicable  INTERVENTION:  -Await diet progression following surgery, further recommendations on follow-up as needed  NUTRITION DIAGNOSIS:   Inadequate oral intake related to acute illness as evidenced by NPO status.  GOAL:   Patient will meet greater than or equal to 90% of their needs  MONITOR:   PO intake, Diet advancement, Labs, Weight trends  REASON FOR ASSESSMENT:   Malnutrition Screening Tool    ASSESSMENT:   35 yo female admitted with abdominal pain with acute cholecystitis with cholelithiasis with lap cholecystectomy on 11/10   NPO at present, pt not in room on visit today due to procedure. Per weight encounters, pt with 6.2% wt loss in 2-3 months. Current illness with acute onset of 6 hours of abdominal pain  Unable to complete Nutrition-Focused physical exam at this time.    Past Medical History:  Diagnosis Date  . Allergy   . Cholelithiasis   . GERD (gastroesophageal reflux disease)      Diet Order:  Diet NPO time specified Except for: Ice Chips  Skin:  Reviewed, no issues  Last BM:  11/9  Height:   Ht Readings from Last 1 Encounters:  11/24/15 5\' 2"  (1.575 m)    Weight:   Wt Readings from Last 1 Encounters:  11/24/15 136 lb (61.7 kg)    Wt Readings from Last 10 Encounters:  11/24/15 136 lb (61.7 kg)  11/15/15 136 lb 12.8 oz (62.1 kg)  11/13/15 138 lb (62.6 kg)  11/08/15 138 lb (62.6 kg)  09/13/15 145 lb (65.8 kg)    BMI:  Body mass index is 24.87 kg/m.  Estimated Nutritional Needs:   Kcal:  1600-1800 kcals  Protein:  75-90  Fluid:  >/= 1.6 L  EDUCATION NEEDS:   No education needs identified at this time  Romelle StarcherCate Aidian Salomon MS, RD, LDN [phone] Pager  [phone] Weekend/On-Call Pager

## 2015-11-25 MED ORDER — CYCLOBENZAPRINE HCL 10 MG PO TABS: 10.0000 mg | ORAL_TABLET | Freq: Three times a day (TID) | ORAL | 0 refills | Status: DC | PRN

## 2015-11-25 MED ORDER — POLYETHYLENE GLYCOL 3350 17 G PO PACK
17.0000 g | PACK | Freq: Every day | ORAL | Status: DC
  Filled 2015-11-25: qty 1

## 2015-11-25 MED ORDER — OXYCODONE-ACETAMINOPHEN 5-325 MG PO TABS: 1.0000 | ORAL_TABLET | ORAL | 0 refills | Status: DC | PRN

## 2015-11-25 NOTE — Discharge Summary (Signed)
Physician Discharge Summary  Patient ID: Amy Marshall MRN: [id number] DOB/AGE: 34-Aug-1982 34 y.o.  Admit date: 11/23/2015 Discharge date: 11/25/2015  Admission Diagnoses: acute cholecystitis   Discharge Diagnoses:  Active Problems:   Acute cholecystitis   Discharged Condition: good  Hospital Course: 35 yr old female with acute cholecystitis has Lap chole on 11/10 with Dr. Orvis BrillLoflin.  She did well postop.  She has been up and walking around.  She has tolerated a regular diet.  Pain controlled with po meds.    Consults: None  Significant Diagnostic Studies: US  Treatments: surgery: Lap chole  Discharge Exam: Blood pressure 110/62, pulse 86, temperature 98.5 F (36.9 C), temperature source Oral, resp. rate 18, height 5\' 2"  (1.575 m), weight 136 lb (61.7 kg), last menstrual period 11/19/2015, SpO2 97 %. General appearance: alert, cooperative and no distress GI: soft, appropriately tender, incisions c/d/i with sterile glue in place, mild bruising to umbilical site Extremities: extremities normal, atraumatic, no cyanosis or edema  Disposition: 01-Home or Self Care  Discharge Instructions    Call MD for:  difficulty breathing, headache or visual disturbances    Complete by:  As directed    Call MD for:  persistant nausea and vomiting    Complete by:  As directed    Call MD for:  redness, tenderness, or signs of infection (pain, swelling, redness, odor or green/yellow discharge around incision site)    Complete by:  As directed    Call MD for:  severe uncontrolled pain    Complete by:  As directed    Call MD for:  temperature >100.4    Complete by:  As directed    Diet general    Complete by:  As directed    Driving Restrictions    Complete by:  As directed    No driving while on prescription pain medication   Increase activity slowly    Complete by:  As directed    Lifting restrictions    Complete by:  As directed    No lifting over 15lbs for 2 weeks   No dressing  needed    Complete by:  As directed        Medication List    TAKE these medications   cyclobenzaprine 10 MG tablet Commonly known as:  FLEXERIL Take 1 tablet (10 mg total) by mouth 3 (three) times daily as needed for muscle spasms.   fluticasone 50 MCG/ACT nasal spray Commonly known as:  FLONASE Place 2 sprays into both nostrils daily.   loratadine 10 MG tablet Commonly known as:  CLARITIN Take 1 tablet (10 mg total) by mouth daily.   oxyCODONE-acetaminophen 5-325 MG tablet Commonly known as:  PERCOCET/ROXICET Take 1-2 tablets by mouth every 4 (four) hours as needed for severe pain. What changed:  See the new instructions.   pantoprazole 40 MG tablet Commonly known as:  PROTONIX Take by mouth.   sucralfate 1 g tablet Commonly known as:  CARAFATE Take by mouth.   triamcinolone cream 0.1 % Commonly known as:  KENALOG APP EXT AA BID FOR UP TO 2 WKS      Follow-up Information    Gladis Riffleatherine L Keahi Mccarney, MD. Call in 2 week(s).   Specialty:  Surgery Why:  call on Monday to get f/u appointment with Dr. Orvis BrillLoflin in 2 weeks Contact information: [address] [place] [address] [phone]           Signed: Gladis RiffleCatherine L Gricel Copen 11/25/2015, 4:04 PM

## 2015-11-27 ENCOUNTER — Telehealth: Payer: Self-pay | Admitting: Surgery

## 2015-11-27 LAB — SURGICAL PATHOLOGY

## 2015-11-27 NOTE — Telephone Encounter (Signed)
Since patient's surgery, she is not to continue antibiotic per discharge summary.  Patient's verbalizes understanding of this and will stop medication at this time.

## 2015-11-27 NOTE — Telephone Encounter (Signed)
Pt called wondering if she should still be taking the antibiotic that she was prescribed by Dr. Aleen CampiPiscoya before the surgery. Please advise

## 2015-11-29 ENCOUNTER — Ambulatory Visit: Payer: Medicaid Other | Admitting: Surgery

## 2015-12-11 ENCOUNTER — Encounter: Payer: Medicaid Other | Admitting: Surgery

## 2015-12-12 ENCOUNTER — Ambulatory Visit (INDEPENDENT_AMBULATORY_CARE_PROVIDER_SITE_OTHER): Payer: Medicaid Other | Admitting: Surgery

## 2015-12-12 ENCOUNTER — Encounter: Payer: Self-pay | Admitting: Surgery

## 2015-12-12 VITALS — BP 119/68 | HR 89 | Temp 98.4°F | Ht 62.0 in | Wt 136.0 lb

## 2015-12-12 DIAGNOSIS — K810 Acute cholecystitis: Secondary | ICD-10-CM

## 2015-12-12 NOTE — Patient Instructions (Signed)
Please call our office if you have questions or concerns.   

## 2015-12-12 NOTE — Progress Notes (Signed)
Outpatient Surgical Follow Up  12/12/2015  Amy Marshall is an 35 y.o. female seen for the diagnosis of Acute cholecystitis [K81.0].  HPI: Patient seen and examined in clinic. She had a laparoscopic cholecystectomy on 11/10 for acute cholecystitis.. Overall she is doing well with no acute complaints. she denies N/V, D/C, fevers or malaise. She states that her appetite has returned she's eating well with no nausea or vomiting and her energy is almost back to normal.  Past Medical History:  Diagnosis Date  . Allergy   . GERD (gastroesophageal reflux disease)     Past Surgical History:  Procedure Laterality Date  . CHOLECYSTECTOMY N/A 11/24/2015   Procedure: LAPAROSCOPIC CHOLECYSTECTOMY;  Surgeon: Gladis Riffleatherine L Karla Pavone, MD;  Location: [facility] ORS;  Service: General;  Laterality: N/A;  . NO PAST SURGERIES      Family History  Problem Relation Age of Onset  . Atrial fibrillation Mother   . Heart disease Father   . Hypertension Father   . Asthma Son     Social History:  reports that she has been smoking Cigarettes.  She has been smoking about 1.00 pack per day. She has never used smokeless tobacco. She reports that she does not drink alcohol or use drugs.  Allergies:  Allergies  Allergen Reactions  . Codeine Nausea And Vomiting    Medications reviewed.  Physical Exam:  BP 119/68   Pulse 89   Temp 98.4 F (36.9 C) (Oral)   Ht 5\' 2"  (1.575 m)   Wt 136 lb (61.7 kg)   LMP 11/19/2015 (Exact Date)   BMI 24.87 kg/m   Gen: patient resting comfortably in clinic, no cardiovascular or respiratory distress Abd/GI: soft, non tender, incision c/d/i, without any erythema or drainage  No results found for this or any previous visit (from the past 48 hour(s)). No results found.  Assessment/Plan: Amy Marshall is an 35 y.o. female seen for the diagnosis of Acute cholecystitis [K81.0]. I discussed with the patient that she's progressing as expected and that she did have some  inflammation in the gallbladder as well as gallstones but no malignancy on the pathology report. I discussed with her that she should be free to go and  to call with any questions or concerns  Santina EvansCatherine L. Rami Waddle MD General Surgeon  12/12/2015,3:59 PM

## 2016-01-26 ENCOUNTER — Telehealth: Payer: Self-pay | Admitting: Surgery

## 2016-01-26 NOTE — Telephone Encounter (Signed)
Returned phone call to patient. Bruise appeared yesterday very faint. Denies trauma to area. Denies nausea/vomiting, fever/chills, or constipation/diarrhea. Denies bulge at site. Denies pain at area. I explained to patient that this does not sound worrisome at all. I asked that she just keep an eye on it and if this worsens or she has increased pain or bulging at area, she should let us know. She verbalizes understanding of this.

## 2016-01-26 NOTE — Telephone Encounter (Signed)
Post laparoscopic cholecystectomy with Dr Orvis BrillLoflin on 11/24/15 @ [facility].   Patient has concerns of a slight bruise that continues to pop up around the belly button, feels the muscle around the navel is tender. No fever, nausea or vomiting. Patient doesn't feel as if she has pulled a muscle. Patient has some concerns of this area.   Please call patient at [phone].

## 2016-02-05 ENCOUNTER — Encounter: Payer: Self-pay | Admitting: *Deleted

## 2016-02-06 ENCOUNTER — Ambulatory Visit: Admission: RE | Admit: 2016-02-06 | Payer: Medicaid Other | Source: Ambulatory Visit | Admitting: Gastroenterology

## 2016-02-06 HISTORY — DX: Headache, unspecified: R51.9

## 2016-02-06 HISTORY — DX: Anxiety disorder, unspecified: F41.9

## 2016-02-06 HISTORY — DX: Headache: R51

## 2016-02-06 SURGERY — EGD (ESOPHAGOGASTRODUODENOSCOPY)
Anesthesia: General

## 2017-05-21 IMAGING — US US ABDOMEN LIMITED
1 series · 14 of 25 positions shown · non-contrast
Comparison: 11/09/2015 ultrasound.

CLINICAL DATA: 35-year-old female with right upper quadrant pain
for weeks. Pain is become worse tonight. Subsequent encounter.

EXAM:
US ABDOMEN LIMITED - RIGHT UPPER QUADRANT

[Series 1: us abdomen limited · 0.19mm/px · 14 of 55 slices shown]
[im 1/55]
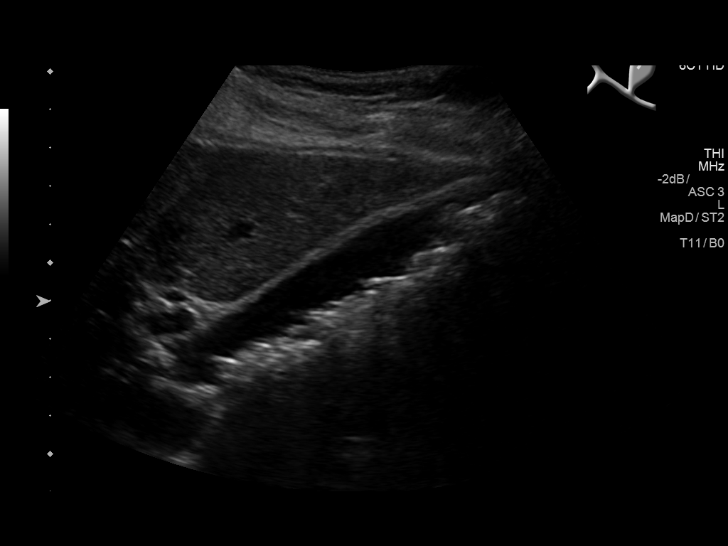
[im 5/55]
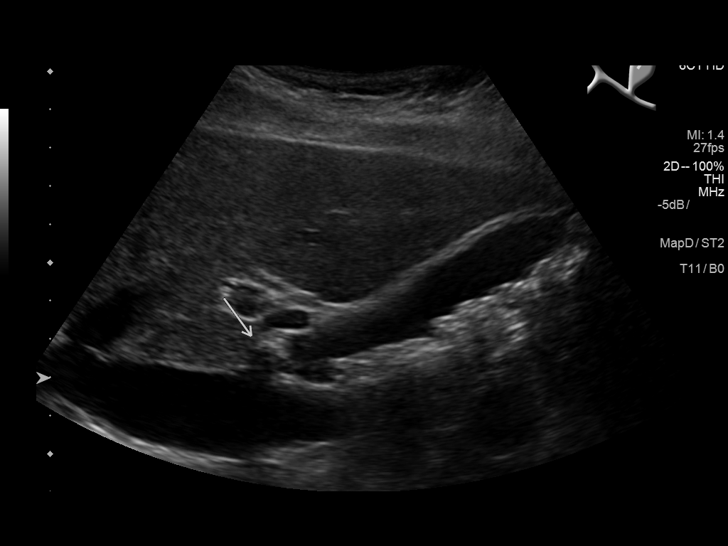
[im 10/55]
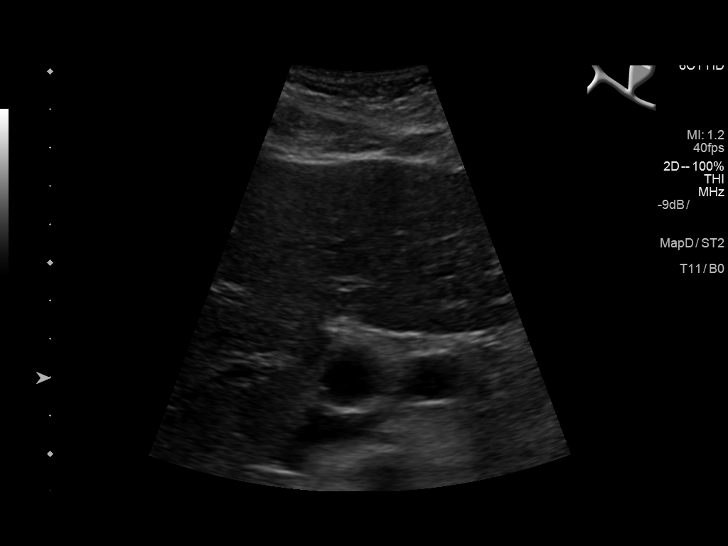
[im 14/55]
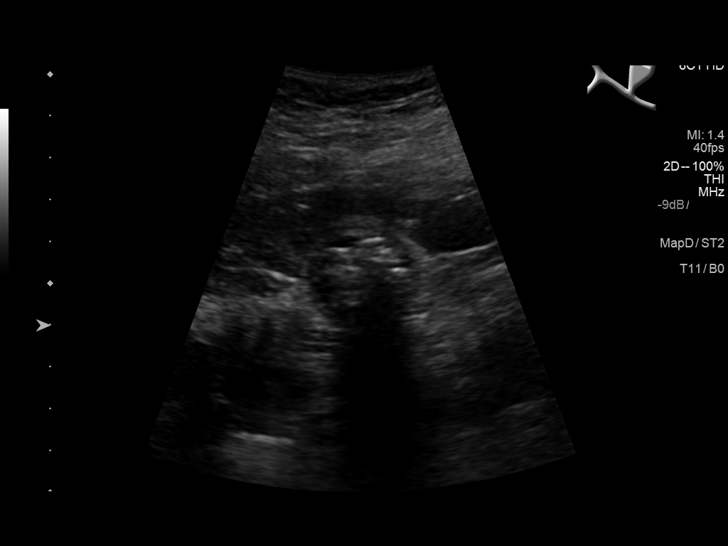
[im 19/55]
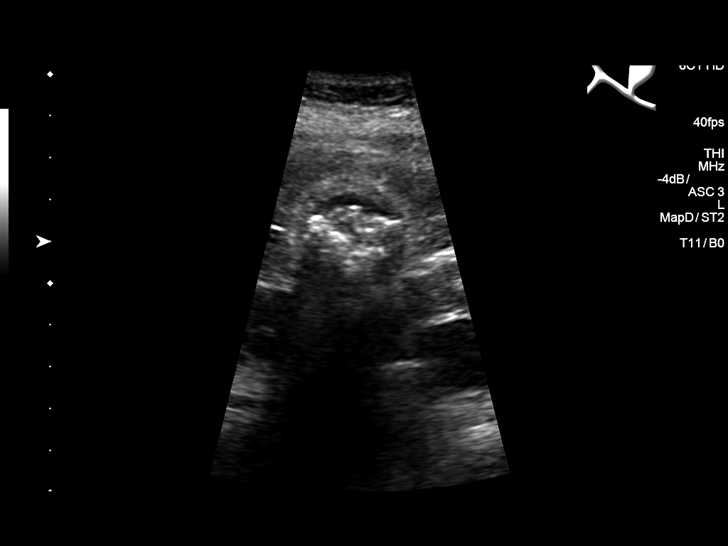
[im 21/55]
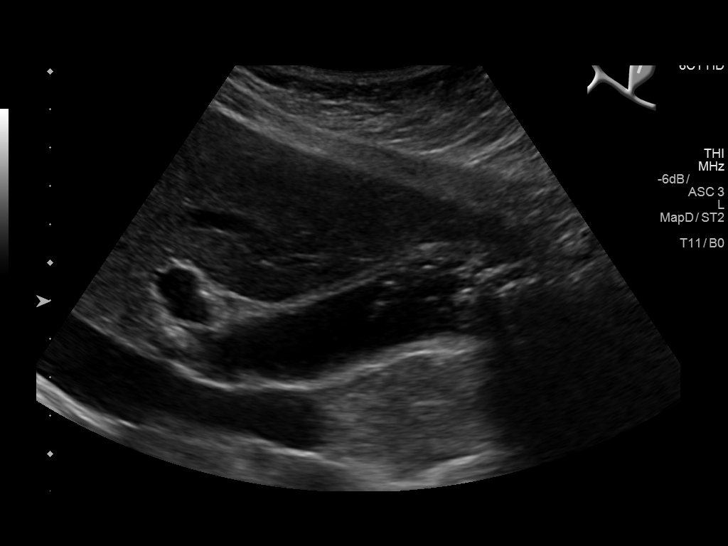
[im 25/55]
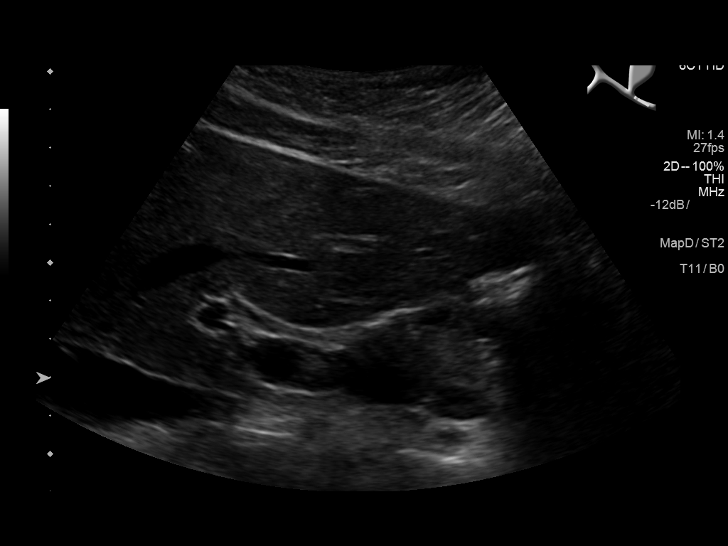
[im 30/55]
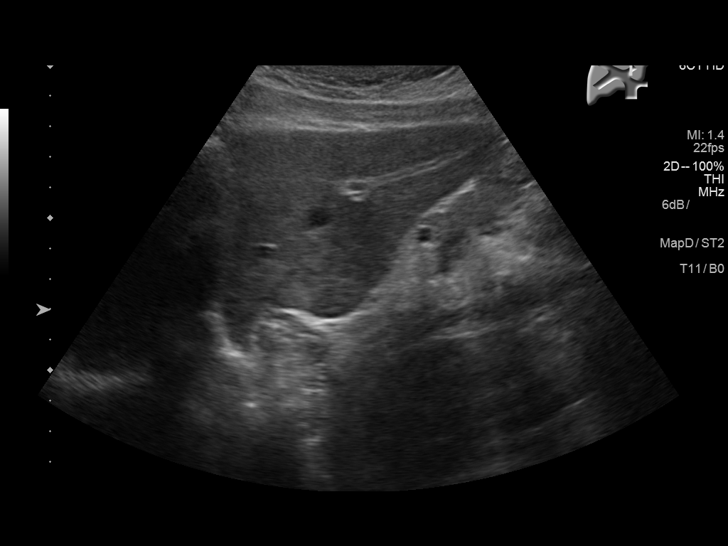
[im 34/55]
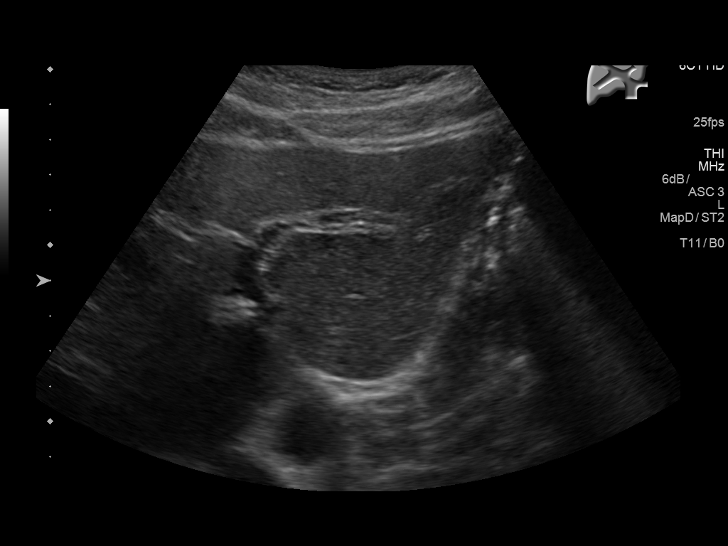
[im 37/55]
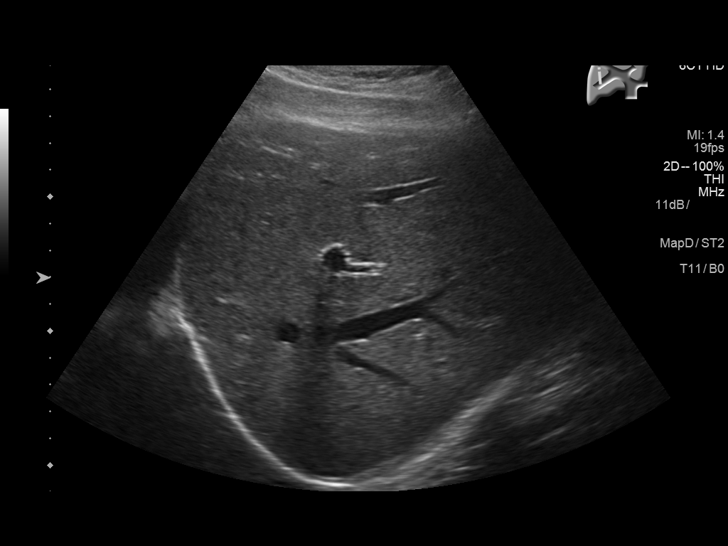
[im 41/55]
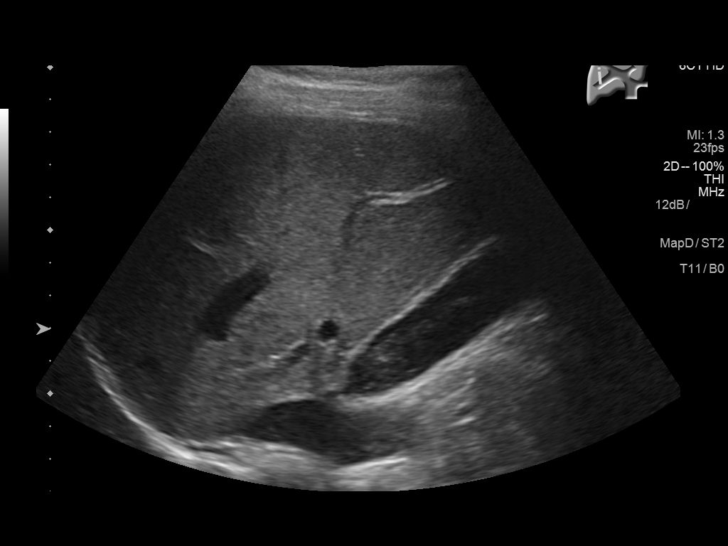
[im 46/55]
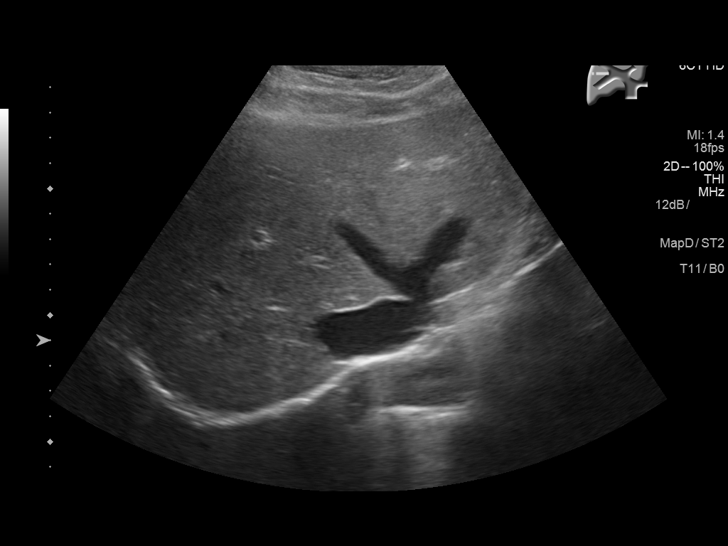
[im 50/55]
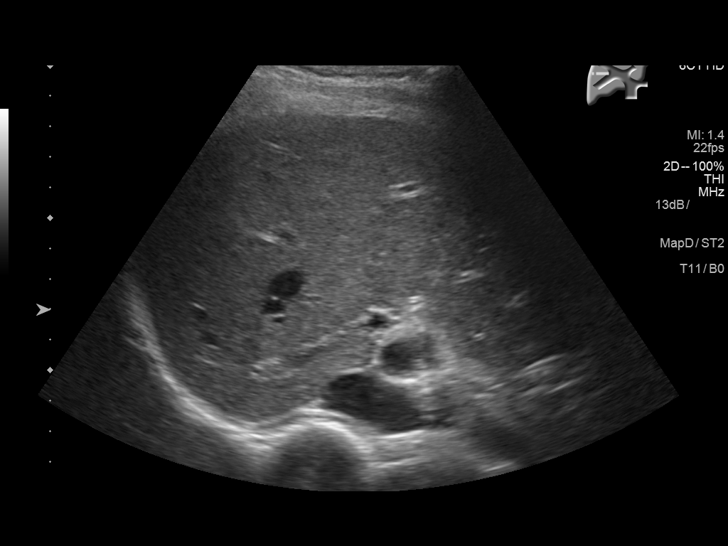
[im 55/55]
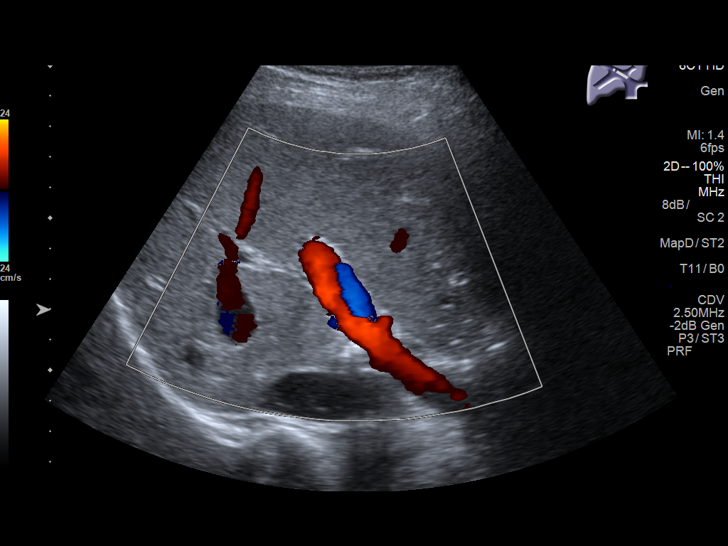

[14 of 25 positions shown; findings below may reference images not displayed]

FINDINGS: Gallbladder:

Multiple gallstones. One gallstone appears lodged in the gallbladder
neck. Gallbladder wall slightly thickened measuring up to 4.1 mm. No
pericholecystic fluid. Ultrasound sonographer did not comment on
whether there was pain during scanning and is presently unavailable.

Common bile duct:

Diameter: 4.1 mm proximally and 4.2 mm mid aspect. Distal aspect not
visualized.

Liver:

No focal lesion identified. Within normal limits in parenchymal
echogenicity.
IMPRESSION: Multiple gallstones. One gallstone appears lodged in the gallbladder
neck. Gallbladder wall slightly thickened measuring up to 4.1 mm.
This gallbladder wall thickening could be related to cholecystitis
in proper clinical setting as versus related to mild contraction.

No proximal or mid common bile duct stone noted. Distal common bile
duct not visualized secondary to bowel gas.

## 2017-09-15 IMAGING — RF DG ESOPHAGUS
11 series · 15 of 15 positions shown · non-contrast
Comparison: None.

CLINICAL DATA: Burning sensation.

EXAM:
ESOPHOGRAM/BARIUM SWALLOW
TECHNIQUE: Combined double contrast and single contrast examination performed
using effervescent crystals, thick barium liquid, and thin barium
liquid.
FLUOROSCOPY TIME:  Fluoroscopy Time:  0 minutes 54 seconds.
Radiation Exposure Index (if provided by the fluoroscopic device):
13.8 mGy
Number of Acquired Spot Images: 18

[Series 1: fluoro_barium 2fps_bw · 0.18mm/px · 3 of 5 frames shown (1 of 11)]
[frame 1/5]
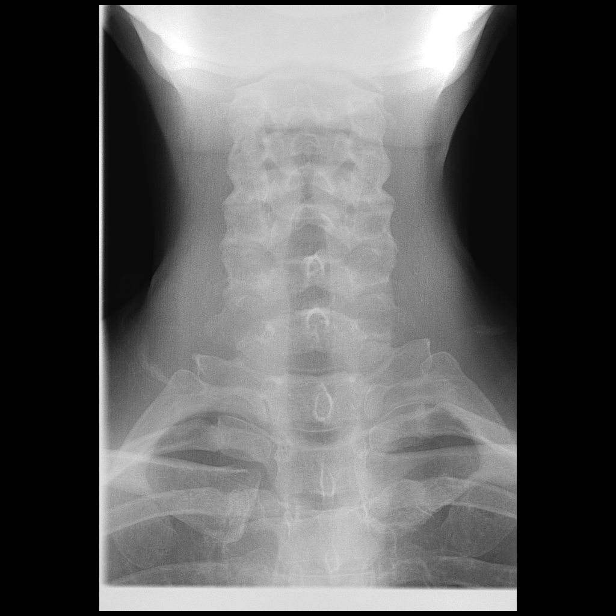
[frame 3/5]
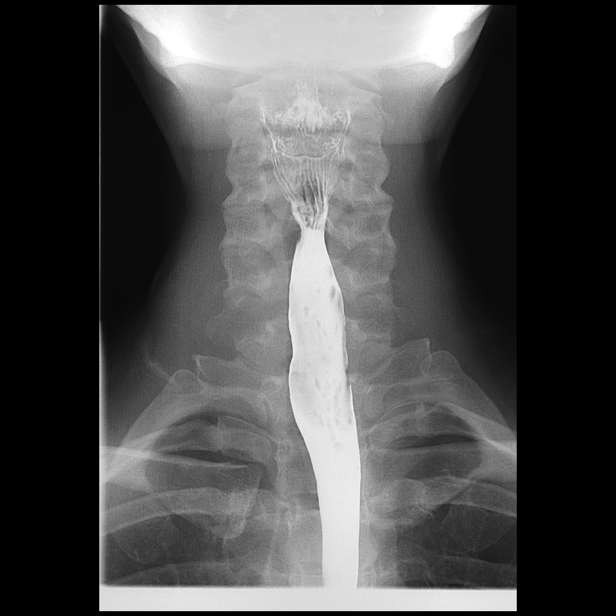
[frame 5/5]
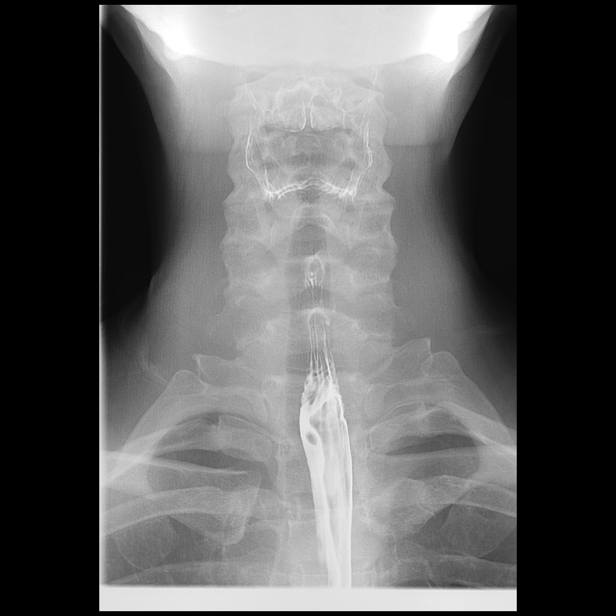

[Series 2: fluoro_barium 2fps_bw · 0.18mm/px · 3 of 5 frames shown (2 of 11)]
[frame 1/5]
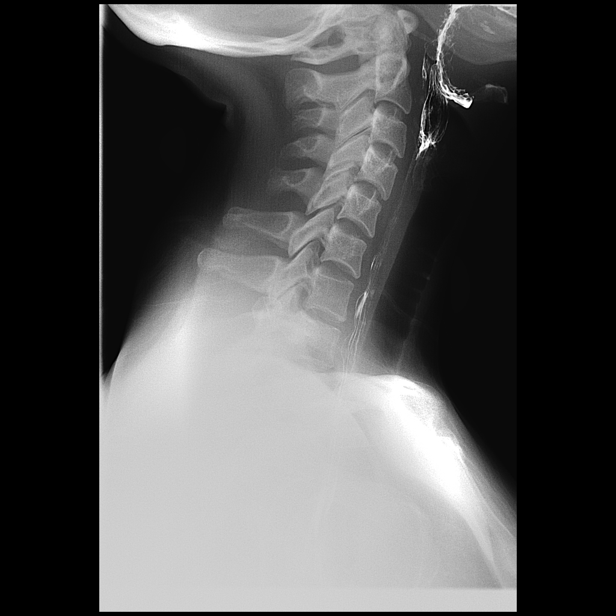
[frame 3/5]
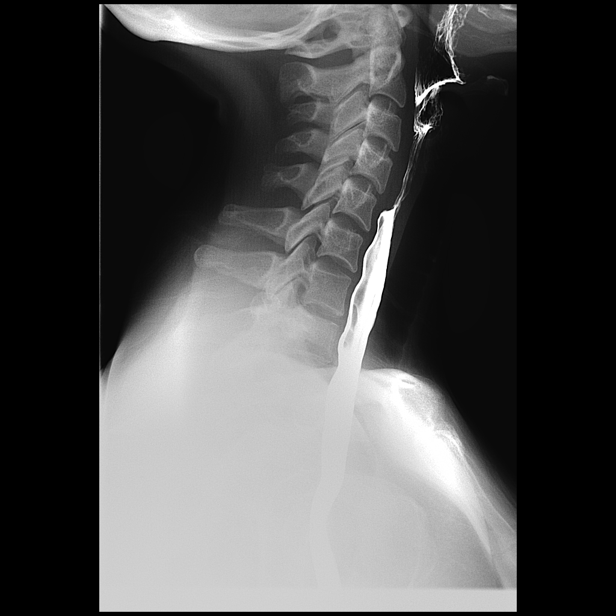
[frame 5/5]
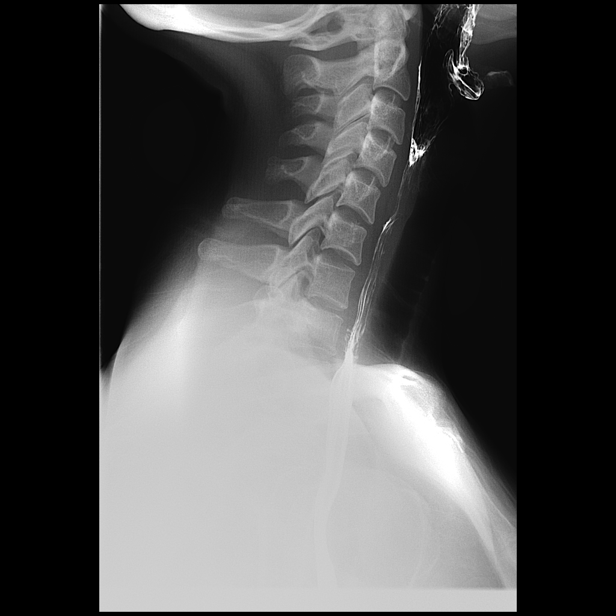

[Series 3: fluoro_barium 2fps_bw · 0.18mm/px · 1 of 1 slices shown (3 of 11)]
[im 1/1]
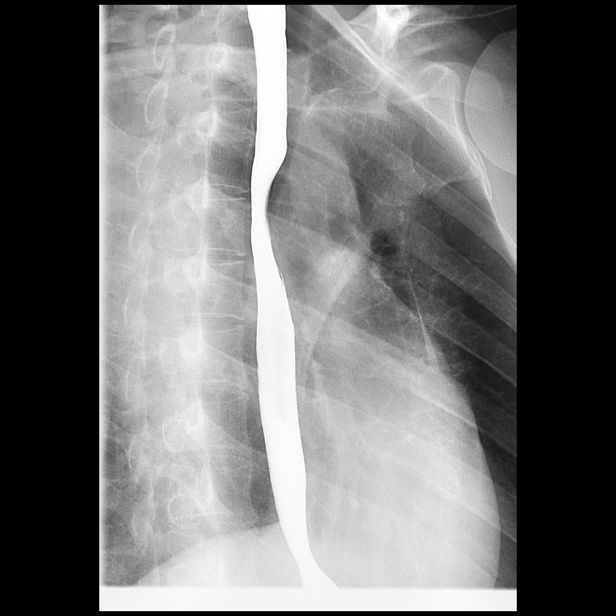

[Series 4: fluoro_barium 2fps_bw · 0.18mm/px · 1 of 1 slices shown (4 of 11)]
[im 1/1]
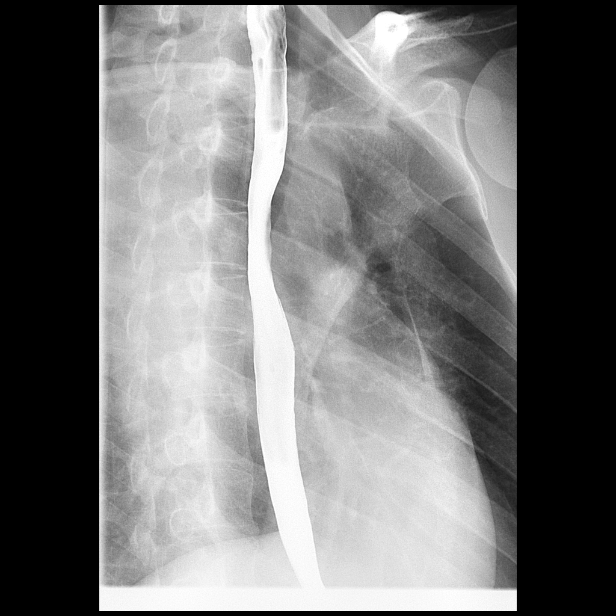

[Series 5: fluoro_barium 2fps_bw · 0.18mm/px · 1 of 1 slices shown (5 of 11)]
[im 1/1]
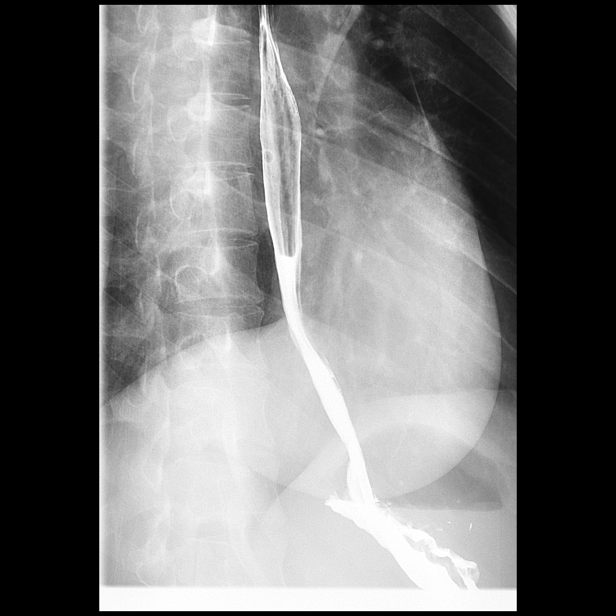

[Series 6: fluoro_barium 2fps_bw · 0.18mm/px · 1 of 1 slices shown (6 of 11)]
[im 1/1]
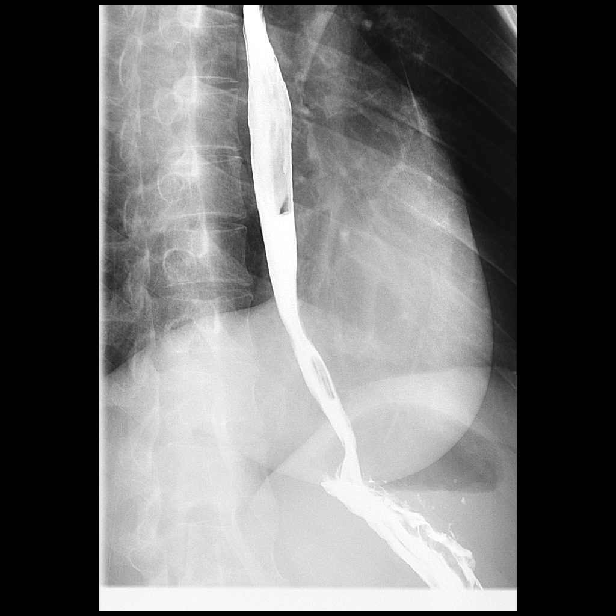

[Series 7: fluoro_barium 2fps_bw · 0.18mm/px · 1 of 1 slices shown (7 of 11)]
[im 1/1]
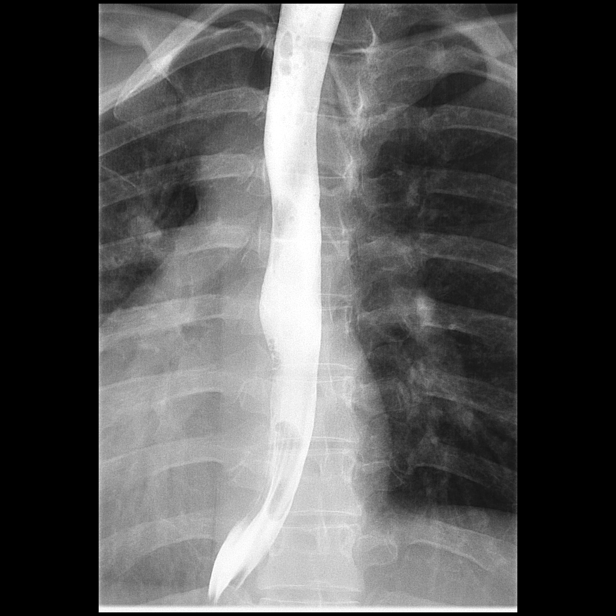

[Series 8: fluoro_barium 2fps_bw · 0.18mm/px · 1 of 1 slices shown (8 of 11)]
[im 1/1]
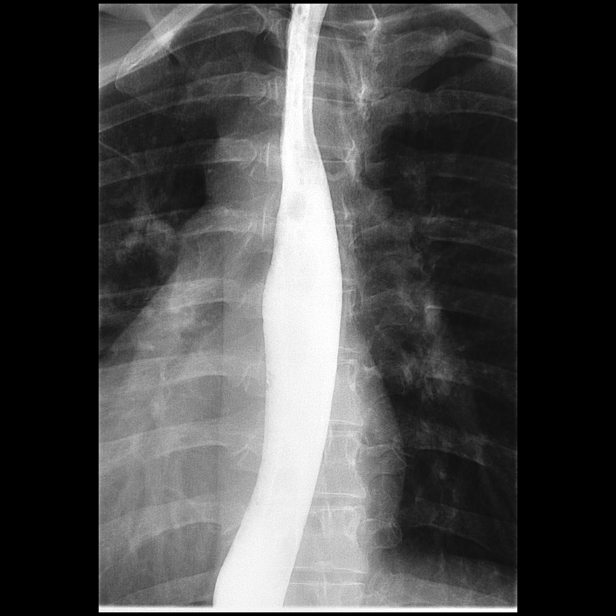

[Series 9: fluoro_barium 2fps_bw · 0.18mm/px · 1 of 1 slices shown (9 of 11)]
[im 1/1]
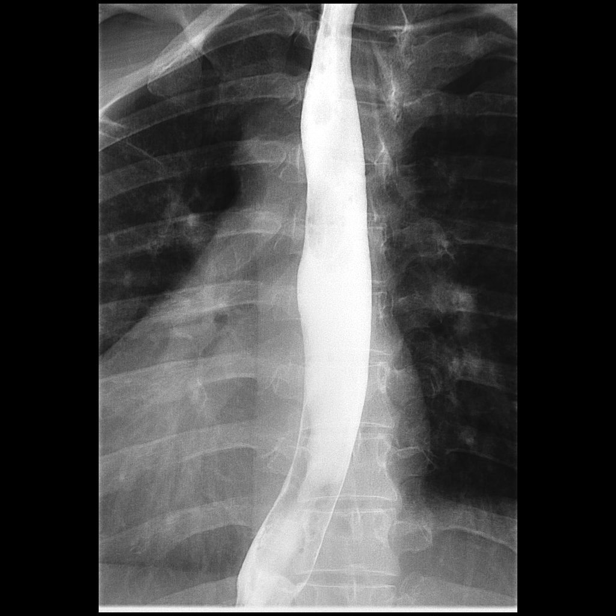

[Series 10: fluoro_barium 2fps_bw · 0.18mm/px · 1 of 1 slices shown (10 of 11)]
[im 1/1]
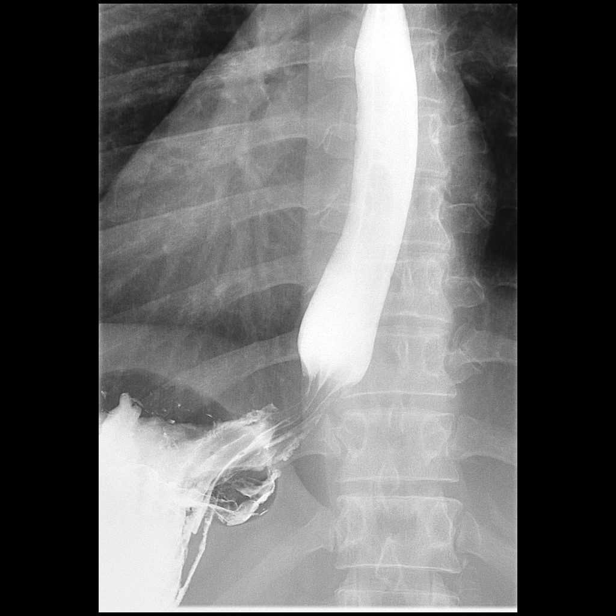

[Series 11: fluoro_barium 2fps_bw · 0.18mm/px · 1 of 1 slices shown (11 of 11)]
[im 1/1]
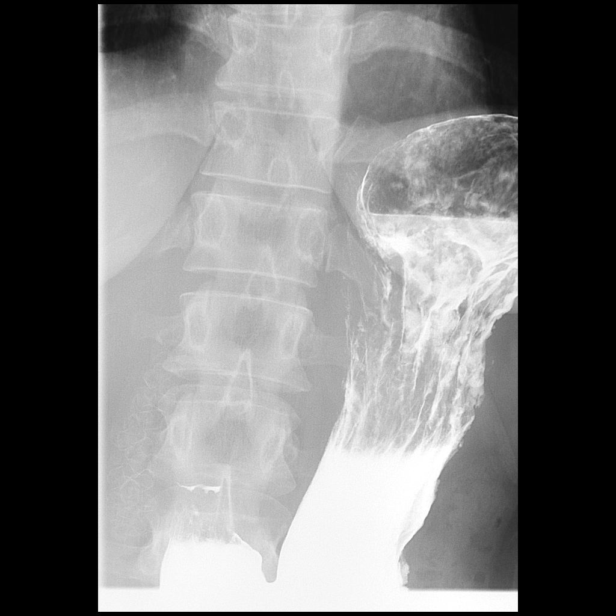

[15 of 15 positions shown; findings below may reference images not displayed]

FINDINGS: Cervical esophagus is unremarkable. The thoracic is widely patent.
No obstructing abnormality identified. No focal abnormality
demonstrated. No evidence of reflux. Standardized barium tablet
passed normally.
IMPRESSION: Normal exam.

## 2020-06-14 ENCOUNTER — Other Ambulatory Visit: Payer: Self-pay | Admitting: Family Medicine

## 2020-06-14 DIAGNOSIS — R102 Pelvic and perineal pain: Secondary | ICD-10-CM

## 2020-07-03 ENCOUNTER — Ambulatory Visit: Payer: Medicaid Other | Attending: Family Medicine

## 2020-07-03 ENCOUNTER — Ambulatory Visit: Payer: Self-pay

## 2022-01-24 ENCOUNTER — Ambulatory Visit
Admission: EM | Admit: 2022-01-24 | Discharge: 2022-01-24 | Disposition: A | Discharge status: Home / Self Care | Payer: Medicaid Other

## 2022-01-24 DIAGNOSIS — J019 Acute sinusitis, unspecified: Secondary | ICD-10-CM | POA: Diagnosis not present

## 2022-01-24 DIAGNOSIS — H6991 Unspecified Eustachian tube disorder, right ear: Secondary | ICD-10-CM

## 2022-01-24 NOTE — ED Provider Notes (Signed)
[name]    CSN: [id number] Arrival date & time: 01/24/22  3235      History   Chief Complaint Chief Complaint  Patient presents with   Otalgia    HPI Amy Marshall is a 42 y.o. female.    Otalgia   Presents to urgent care with complaint of right ear pain and fullness x 5 days. Denies allergies or recent URI symptoms.  She endorses hx of anxiety and panic disorder.  Past Medical History:  Diagnosis Date   Allergy    Anxiety    GERD (gastroesophageal reflux disease)    Headache    migraines    Patient Active Problem List   Diagnosis Date Noted   Dyspepsia 10/11/2015    Past Surgical History:  Procedure Laterality Date   CHOLECYSTECTOMY N/A 11/24/2015   Procedure: LAPAROSCOPIC CHOLECYSTECTOMY;  Surgeon: Hubbard Robinson, MD;  Location: [facility] ORS;  Service: General;  Laterality: N/A;   NO PAST SURGERIES      OB History   No obstetric history on file.      Home Medications    Prior to Admission medications   Medication Sig Start Date End Date Taking? Authorizing Provider  fluticasone (FLONASE) 50 MCG/ACT nasal spray Place 2 sprays into both nostrils daily. 09/13/15   Hagler, Jami L, PA-C  pantoprazole (PROTONIX) 40 MG tablet Take 40 mg by mouth daily.    [provider]  sucralfate (CARAFATE) 1 g tablet Take 1 g by mouth 4 (four) times daily -  with meals and at bedtime.    [provider]    Family History Family History  Problem Relation Age of Onset   Atrial fibrillation Mother    Heart disease Father    Hypertension Father    Asthma Son     Social History Social History   Tobacco Use   Smoking status: Every Day    Packs/day: 1.00    Types: Cigarettes   Smokeless tobacco: Never  Substance Use Topics   Alcohol use: No   Drug use: No     Allergies   Codeine   Review of Systems Review of Systems  HENT:  Positive for ear pain.      Physical Exam Triage Vital Signs ED Triage Vitals  [01/24/22 1001]  Enc Vitals Group     BP 110/70     Pulse Rate 92     Resp 16     Temp 97.8 F (36.6 C)     Temp src      SpO2 95 %     Weight      Height      Head Circumference      Peak Flow      Pain Score 0     Pain Loc      Pain Edu?      Excl. in [place]?    No data found.  Updated Vital Signs BP 110/70   Pulse 92   Temp 97.8 F (36.6 C)   Resp 16   LMP  (LMP Unknown)   SpO2 95%   Visual Acuity Right Eye Distance:   Left Eye Distance:   Bilateral Distance:    Right Eye Near:   Left Eye Near:    Bilateral Near:     Physical Exam Vitals reviewed.  Constitutional:      Appearance: Normal appearance.  HENT:     Right Ear: A middle ear effusion is present. Tympanic membrane is bulging.  Skin:    General: Skin is warm and dry.  Neurological:     General: No focal deficit present.     Mental Status: She is alert and oriented to person, place, and time.  Psychiatric:        Mood and Affect: Mood normal.        Behavior: Behavior normal.      UC Treatments / Results  Labs (all labs ordered are listed, but only abnormal results are displayed) Labs Reviewed - No data to display  EKG   Radiology No results found.  Procedures Procedures (including critical care time)  Medications Ordered in UC Medications - No data to display  Initial Impression / Assessment and Plan / UC Course  I have reviewed the triage vital signs and the nursing notes.  Pertinent labs & imaging results that were available during my care of the patient were reviewed by me and considered in my medical decision making (see chart for details).   Patient is afebrile here without recent antipyretics. Satting well on room air. Overall is well appearing, well hydrated, without respiratory distress.   Suspect sinusitis causing eustachian tube disorder.  Corticosteroid treatment is conjugated for patient given her anxiety/panic disorder.  Recommended conservative treatment using nasal  decongestants.  Final Clinical Impressions(s) / UC Diagnoses   Final diagnoses:  None   Discharge Instructions   None    ED Prescriptions   None    PDMP not reviewed this encounter.   Rose Phi, [place] 01/24/22 1038

## 2022-01-24 NOTE — Discharge Instructions (Addendum)
Follow up here or with your primary care provider if your symptoms are worsening or not improving.     

## 2022-01-24 NOTE — ED Triage Notes (Signed)
Pt. Presents to [facility] w/ c/o right ear pain and fullness for the past 5 days.

## 2022-01-28 ENCOUNTER — Ambulatory Visit (INDEPENDENT_AMBULATORY_CARE_PROVIDER_SITE_OTHER): Payer: Medicaid Other | Admitting: Nurse Practitioner

## 2022-01-28 ENCOUNTER — Encounter: Payer: Self-pay | Admitting: Nurse Practitioner

## 2022-01-28 VITALS — BP 108/68 | HR 104 | Ht 62.0 in | Wt 177.0 lb

## 2022-01-28 DIAGNOSIS — F419 Anxiety disorder, unspecified: Secondary | ICD-10-CM | POA: Insufficient documentation

## 2022-01-28 DIAGNOSIS — G43109 Migraine with aura, not intractable, without status migrainosus: Secondary | ICD-10-CM | POA: Insufficient documentation

## 2022-01-28 DIAGNOSIS — F32A Depression, unspecified: Secondary | ICD-10-CM | POA: Diagnosis not present

## 2022-01-28 DIAGNOSIS — H6991 Unspecified Eustachian tube disorder, right ear: Secondary | ICD-10-CM | POA: Diagnosis not present

## 2022-01-28 DIAGNOSIS — R635 Abnormal weight gain: Secondary | ICD-10-CM | POA: Diagnosis not present

## 2022-01-28 LAB — CBC
HCT: 43.4 % (ref 36.0–46.0)
Hemoglobin: 14.6 g/dL (ref 12.0–15.0)
MCHC: 33.6 g/dL (ref 30.0–36.0)
MCV: 92.5 fl (ref 78.0–100.0)
Platelets: 275 10*3/uL (ref 150.0–400.0)
RBC: 4.69 Mil/uL (ref 3.87–5.11)
RDW: 12.4 % (ref 11.5–15.5)
WBC: 11.8 10*3/uL — ABNORMAL HIGH (ref 4.0–10.5)

## 2022-01-28 LAB — COMPREHENSIVE METABOLIC PANEL
ALT: 18 U/L (ref 0–35)
AST: 16 U/L (ref 0–37)
Albumin: 4.3 g/dL (ref 3.5–5.2)
Alkaline Phosphatase: 75 U/L (ref 39–117)
BUN: 9 mg/dL (ref 6–23)
CO2: 27 mEq/L (ref 19–32)
Calcium: 9.1 mg/dL (ref 8.4–10.5)
Chloride: 106 mEq/L (ref 96–112)
Creatinine, Ser: 0.6 mg/dL (ref 0.40–1.20)
GFR: 112.1 mL/min (ref >60.00)
Glucose, Bld: 91 mg/dL (ref 70–99)
Potassium: 4.1 mEq/L (ref 3.5–5.1)
Sodium: 140 mEq/L (ref 135–145)
Total Bilirubin: 0.4 mg/dL (ref 0.2–1.2)
Total Protein: 6.7 g/dL (ref 6.0–8.3)

## 2022-01-28 LAB — TSH: TSH: 1.48 u[IU]/mL (ref 0.35–5.50)

## 2022-01-28 LAB — HEMOGLOBIN A1C: Hgb A1c MFr Bld: 5.6 % (ref 4.6–6.5)

## 2022-01-28 MED ORDER — VENLAFAXINE HCL ER 37.5 MG PO CP24: 37.5000 mg | ORAL_CAPSULE | Freq: Every day | ORAL | 0 refills | Status: DC

## 2022-01-28 MED ORDER — NURTEC 75 MG PO TBDP: 1.0000 | ORAL_TABLET | ORAL | 0 refills | Status: DC | PRN

## 2022-01-28 MED ORDER — PREDNISONE 20 MG PO TABS: ORAL_TABLET | ORAL | 0 refills | Status: AC

## 2022-01-28 NOTE — Assessment & Plan Note (Signed)
Migraines occur approximately 2 times a month.  Patient has not tried abortive therapy in the past she does have intolerance to codeine and has not had a hemiplegic migraine in the past so makes her ineligible to try triptans.  Will try Nurtec ODT.  Prescription sent to pharmacy

## 2022-01-28 NOTE — Assessment & Plan Note (Signed)
Patient has tried many medications in the past inclusive Zoloft, Wellbutrin, citalopram.  PHQ-9 and GAD-7 administered in office.  Patient denies HI/SI/AVH.  Will try patient on venlafaxine 37 and half milligrams daily.  Hopefully this will help with mood and her migraine headaches.

## 2022-01-28 NOTE — Assessment & Plan Note (Signed)
Patient is concerned because she is felt like she is gained quite a bit of weight being on antidepressant.  She has come off of it for a month.  Will pend labs including a TSH and A1c.

## 2022-01-28 NOTE — Assessment & Plan Note (Signed)
Has tried conservative treatment such as nasal spray and Sudafed without great relief.  Will put patient on prednisone taper as directed.  Prednisone precautions reviewed

## 2022-01-28 NOTE — Progress Notes (Signed)
New Patient Office Visit  Subjective    Patient ID: Amy Marshall, female    DOB: 1980-11-01  Age: 42 y.o. MRN: [id number]  CC:  Chief Complaint  Patient presents with   Establish Care    HPI Amy Marshall presents to establish care  [facility] [place] CPE: unsure 1 ppd for 20 years. States that she has quit in the past. She has used the vape and patch in the past.   Anxiety/depression: have not been on medication for a month. States that she gained weight on the medication. States that she is moving a lot with her employment States that some days she will eat once a day and 2 some days. With some snacking. She is moving towards healthy snacks. States that she has zoloft, welbutrin and citalopram  States that she feels very moody without the medication  States that she does have a mirena and would have a little spotting. States now she is having some breakthrough bleeding. States that it has been in for approx 2-3 and was placed at [facility]: States that she has a history. Never been to neuorlogy. But has had a CT scan in the past. States that she is getting 2 times a month. States that she will have an aura. Static on her perherials. Sometimes blurry vision in certain areas. States that if she take ibuprofen or BC powder.  Right side of the head or left side of the head. Nausea and vomit. Light and sound sensitivity. 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 States that with air pressure it can make a difference    Pap smear: 2-3 years ago. States that she has had a past abnormal then would go back and normal. No colopsocpy or leep.  Mammgram: never had  CRC: too young, currently average risk    Outpatient Encounter Medications as of 01/28/2022  Medication Sig   fluticasone (FLONASE) 50 MCG/ACT nasal spray Place 2 sprays into both nostrils daily.   predniSONE (DELTASONE) 20 MG tablet Take 2 tablets (40 mg total) by mouth  daily with breakfast for 3 days, THEN 1 tablet (20 mg total) daily with breakfast for 3 days. Avoid NSAIDs like: ibuprofen, motrin, aleve, naproxen, BC/Goody powder.   Rimegepant Sulfate (NURTEC) 75 MG TBDP Take 1 tablet by mouth as needed (for migraine headace).   venlafaxine XR (EFFEXOR XR) 37.5 MG 24 hr capsule Take 1 capsule (37.5 mg total) by mouth daily with breakfast.   cetirizine (ZYRTEC) 10 MG tablet Take 10 mg by mouth daily as needed for allergies. (Patient not taking: Reported on 01/28/2022)   [DISCONTINUED] citalopram (CELEXA) 20 MG tablet Take 20 mg by mouth daily. (Patient not taking: Reported on 01/28/2022)   [DISCONTINUED] pantoprazole (PROTONIX) 40 MG tablet Take 40 mg by mouth daily.   [DISCONTINUED] sucralfate (CARAFATE) 1 g tablet Take 1 g by mouth 4 (four) times daily -  with meals and at bedtime.   No facility-administered encounter medications on file as of 01/28/2022.    Past Medical History:  Diagnosis Date   Allergy    Anxiety    GERD (gastroesophageal reflux disease)    Headache    migraines    Past Surgical History:  Procedure Laterality Date   CHOLECYSTECTOMY N/A 11/24/2015   Procedure: LAPAROSCOPIC CHOLECYSTECTOMY;  Surgeon: Hubbard Robinson, MD;  Location: [facility] ORS;  Service: General;  Laterality: N/A;   NO PAST SURGERIES  Family History  Problem Relation Age of Onset   Atrial fibrillation Mother    Alcohol abuse Mother    COPD Mother    Depression Mother    Heart attack Mother    Heart attack Father    Cancer Father    Alcohol abuse Father    Heart disease Father    Hypertension Father    Depression Brother    Asthma Son     Social History   Socioeconomic History   Marital status: Single    Spouse name: Not on file   Number of children: 3   Years of education: Not on file   Highest education level: Not on file  Occupational History   Not on file  Tobacco Use   Smoking status: Every Day    Packs/day: 1.00    Types: Cigarettes    Smokeless tobacco: Never  Substance and Sexual Activity   Alcohol use: No   Drug use: No   Sexual activity: Not on file  Other Topics Concern   Not on file  Social History Narrative   Amy Marshall (23, F)   Micheal (20, M)   Chole (14, F)   Eli (8,M) stepson      One grandchild Jewett       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  Respiratory:  Negative for shortness of breath.   Cardiovascular:  Negative for chest pain.  Gastrointestinal:  Negative for abdominal pain, blood in stool, constipation, diarrhea, nausea and vomiting.       BM every other day or daily   Genitourinary:  Negative for dysuria and hematuria.  Neurological:  Positive for headaches.  Psychiatric/Behavioral:  Negative for hallucinations and suicidal ideas.         Objective    BP 108/68   Pulse (!) 104   Ht 5\' 2"  (1.575 m)   Wt 177 lb (80.3 kg)   LMP  (LMP Unknown)   SpO2 97%   BMI 32.37 kg/m   Physical Exam Vitals and nursing note reviewed.  Constitutional:      Appearance: Normal appearance.  HENT:     Right Ear: Ear canal and external ear normal.     Left Ear: Tympanic membrane, ear canal and external ear normal.     Mouth/Throat:     Mouth: Mucous membranes are moist.     Pharynx: Oropharynx is clear.  Eyes:     Extraocular Movements: Extraocular movements intact.     Pupils: Pupils are equal, round, and reactive to light.  Cardiovascular:     Rate and Rhythm: Normal rate and regular rhythm.     Pulses: Normal pulses.     Heart sounds: Normal heart sounds.  Pulmonary:     Effort: Pulmonary effort is normal.      Breath sounds: Normal breath sounds.  Musculoskeletal:     Right lower leg: No edema.     Left lower leg: No edema.  Lymphadenopathy:     Cervical: No cervical adenopathy.  Skin:    General: Skin is warm.  Neurological:  General: No focal deficit present.     Mental Status: She is alert.     Deep Tendon Reflexes:     Reflex Scores:      Bicep reflexes are 2+ on the right side and 2+ on the left side.      Patellar reflexes are 2+ on the right side and 2+ on the left side.    Comments: Bilateral upper and lower extremity strength 5/5  Psychiatric:        Mood and Affect: Mood normal.        Behavior: Behavior normal.        Thought Content: Thought content normal.        Judgment: Judgment normal.         Assessment & Plan:   Problem List Items Addressed This Visit       Cardiovascular and Mediastinum   Migraine with aura and without status migrainosus, not intractable    Migraines occur approximately 2 times a month.  Patient has not tried abortive therapy in the past she does have intolerance to codeine and has not had a hemiplegic migraine in the past so makes her ineligible to try triptans.  Will try Nurtec ODT.  Prescription sent to pharmacy      Relevant Medications   venlafaxine XR (EFFEXOR XR) 37.5 MG 24 hr capsule   Rimegepant Sulfate (NURTEC) 75 MG TBDP   Other Relevant Orders   CBC   Comprehensive metabolic panel   TSH     Nervous and Auditory   Eustachian tube dysfunction, right    Has tried conservative treatment such as nasal spray and Sudafed without great relief.  Will put patient on prednisone taper as directed.  Prednisone precautions reviewed      Relevant Medications   predniSONE (DELTASONE) 20 MG tablet     Other   Anxiety and depression - Primary    Patient has tried many medications in the past inclusive Zoloft, Wellbutrin, citalopram.  PHQ-9 and GAD-7 administered in office.  Patient denies HI/SI/AVH.  Will try patient on  venlafaxine 37 and half milligrams daily.  Hopefully this will help with mood and her migraine headaches.      Relevant Medications   venlafaxine XR (EFFEXOR XR) 37.5 MG 24 hr capsule   Other Relevant Orders   CBC   Comprehensive metabolic panel   Abnormal weight gain    Patient is concerned because she is felt like she is gained quite a bit of weight being on antidepressant.  She has come off of it for a month.  Will pend labs including a TSH and A1c.      Relevant Orders   CBC   Hemoglobin A1c   Comprehensive metabolic panel   TSH    Return in about 6 weeks (around 03/11/2022) for MDD/GAD/Migraine f/u.   Audria Nine, NP

## 2022-01-28 NOTE — Patient Instructions (Addendum)
Nice to see you today I have sent in 3 medications total I want to see you in 6 weeks for a follow up, sooner if you need me I will be in touch with the labs once I have the results and reviewed them

## 2022-02-19 ENCOUNTER — Encounter: Payer: Self-pay | Admitting: Nurse Practitioner

## 2022-02-19 DIAGNOSIS — H6991 Unspecified Eustachian tube disorder, right ear: Secondary | ICD-10-CM

## 2022-02-26 ENCOUNTER — Encounter: Payer: Self-pay | Admitting: *Deleted

## 2022-02-26 ENCOUNTER — Telehealth: Payer: Self-pay | Admitting: Nurse Practitioner

## 2022-02-26 NOTE — Telephone Encounter (Signed)
Patient called to follow up on referral that was sent out to ENT.

## 2022-03-05 ENCOUNTER — Ambulatory Visit (INDEPENDENT_AMBULATORY_CARE_PROVIDER_SITE_OTHER): Payer: Medicaid Other | Admitting: Nurse Practitioner

## 2022-03-05 ENCOUNTER — Encounter: Payer: Self-pay | Admitting: Nurse Practitioner

## 2022-03-05 VITALS — BP 110/66 | HR 87 | Temp 99.0°F | Resp 16 | Ht 62.0 in | Wt 175.2 lb

## 2022-03-05 DIAGNOSIS — H6992 Unspecified Eustachian tube disorder, left ear: Secondary | ICD-10-CM | POA: Insufficient documentation

## 2022-03-05 DIAGNOSIS — J0110 Acute frontal sinusitis, unspecified: Secondary | ICD-10-CM

## 2022-03-05 MED ORDER — AMOXICILLIN-POT CLAVULANATE 875-125 MG PO TABS: 1.0000 | ORAL_TABLET | Freq: Two times a day (BID) | ORAL | 0 refills | Status: AC

## 2022-03-05 NOTE — Progress Notes (Signed)
Acute Office Visit  Subjective:     Patient ID: Amy Marshall, female    DOB: 02-Mar-1980, 42 y.o.   MRN: [id number]  Chief Complaint  Patient presents with   Ear Pain    X 2 months   Sinus Problem    X 2 months    Sinus Problem Associated symptoms include ear pain and headaches. Pertinent negatives include no chills, coughing (intermittent from drianage) or sore throat.   Patient is in today for Ear pain/sinus pain   Patient was seen by me on 01/28/2022 and was placed on prednisone after she had tried Flonase and sudafed. She was referred to ENT in [place] that her left ear is starting to hurt and is having hearing decreased. States that she has been having sinus stuff for approx 3 weeks No sick contact No Covid test since States that she did try pseudofed again without good relief.  Patient is still taking her antihistamine and Flonase nasal spray  Review of Systems  Constitutional:  Negative for chills, fever and malaise/fatigue.       Appetite is normal  Plenty of fluid intake  HENT:  Positive for ear pain, hearing loss and sinus pain. Negative for sore throat.   Respiratory:  Negative for cough (intermittent from drianage).   Musculoskeletal:  Negative for joint pain and myalgias.  Neurological:  Positive for headaches.        Objective:    BP 110/66   Pulse 87   Temp 99 F (37.2 C)   Resp 16   Ht 5' 2"$  (1.575 m)   Wt 175 lb 4 oz (79.5 kg)   SpO2 98%   BMI 32.05 kg/m    Physical Exam Vitals and nursing note reviewed.  Constitutional:      Appearance: Normal appearance.  HENT:     Right Ear: Tympanic membrane, ear canal and external ear normal.     Left Ear: Tympanic membrane, ear canal and external ear normal.     Nose:     Right Sinus: No maxillary sinus tenderness.     Left Sinus: Frontal sinus tenderness present. No maxillary sinus tenderness.     Mouth/Throat:     Mouth: Mucous membranes are moist.     Pharynx: Oropharynx  is clear.  Cardiovascular:     Rate and Rhythm: Normal rate and regular rhythm.     Heart sounds: Normal heart sounds.  Pulmonary:     Effort: Pulmonary effort is normal.     Breath sounds: Normal breath sounds.  Neurological:     Mental Status: She is alert.     No results found for any visits on 03/05/22.      Assessment & Plan:   Problem List Items Addressed This Visit       Respiratory   Acute non-recurrent frontal sinusitis    Will treat patient's sinus infection with Augmentin 875-125 mg twice daily for 7 days.  She does have an appoint with ENT strongly encouraged her to Keep that appointment      Relevant Medications   amoxicillin-clavulanate (AUGMENTIN) 875-125 MG tablet     Nervous and Auditory   Eustachian tube dysfunction, left - Primary    Patient has tried Flonase, second-generation histamine, Sudafed without great relief.  She will try Afrin no more than 3 days of use.  Patient has ENT visit within the week keep it       Meds ordered this encounter  Medications  amoxicillin-clavulanate (AUGMENTIN) 875-125 MG tablet    Sig: Take 1 tablet by mouth 2 (two) times daily for 7 days.    Dispense:  14 tablet    Refill:  0    Order Specific Question:   Supervising Provider    Answer:   Loura Pardon A [[id number]]    Return in about 3 weeks (around 03/26/2022) for MDD/GAD recheck .  Romilda Garret, NP

## 2022-03-05 NOTE — Assessment & Plan Note (Signed)
Will treat patient's sinus infection with Augmentin 875-125 mg twice daily for 7 days.  She does have an appoint with ENT strongly encouraged her to Keep that appointment

## 2022-03-05 NOTE — Patient Instructions (Signed)
Nice to see you today The antibiotics have been called in to the pharmacy Follow up with me in 3 weeks. You can cancel the appt for 03/11/2022 Keep appointment with ENT as scheduled

## 2022-03-05 NOTE — Assessment & Plan Note (Signed)
Patient has tried Flonase, second-generation histamine, Sudafed without great relief.  She will try Afrin no more than 3 days of use.  Patient has ENT visit within the week keep it

## 2022-03-11 ENCOUNTER — Ambulatory Visit: Payer: Medicaid Other | Admitting: Nurse Practitioner

## 2022-03-26 ENCOUNTER — Ambulatory Visit: Payer: Medicaid Other | Admitting: Nurse Practitioner

## 2022-03-27 ENCOUNTER — Encounter: Payer: Self-pay | Admitting: Nurse Practitioner

## 2022-04-25 ENCOUNTER — Other Ambulatory Visit: Payer: Self-pay | Admitting: Nurse Practitioner

## 2022-04-25 DIAGNOSIS — F32A Depression, unspecified: Secondary | ICD-10-CM

## 2022-04-25 NOTE — Telephone Encounter (Signed)
PT does not need a refill as of yet. She quit taking it because she was on prednisone. She has completed the Prednisone now and will restart the Effexor soon. Will call when she needs a refill. Did state that when she was taking it that it helped.

## 2022-05-14 ENCOUNTER — Encounter: Payer: Self-pay | Admitting: Nurse Practitioner

## 2022-05-14 DIAGNOSIS — G43109 Migraine with aura, not intractable, without status migrainosus: Secondary | ICD-10-CM

## 2022-05-15 NOTE — Telephone Encounter (Signed)
Can we check the status on the Nurtec please

## 2022-05-20 NOTE — Telephone Encounter (Signed)
   Previous approval on file

## 2022-05-22 MED ORDER — NURTEC 75 MG PO TBDP: 1.0000 | ORAL_TABLET | ORAL | 0 refills | Status: AC | PRN

## 2022-06-03 ENCOUNTER — Telehealth: Payer: Self-pay | Admitting: Nurse Practitioner

## 2022-06-03 NOTE — Telephone Encounter (Signed)
See Tresa Endo CMA note and Audria Nine NP note below access nurse note. Sending to Audria Nine NP.

## 2022-06-03 NOTE — Telephone Encounter (Signed)
Noted. Will evaluate in office 

## 2022-06-03 NOTE — Telephone Encounter (Signed)
FYI: This call has been transferred to Access Nurse. Once the result note has been entered staff can address the message at that time.  Patient called in with the following symptoms:  Red Word:dizziness  Patient called in and stated that she been experiencing dizziness and nauseous. She also stated that she felt lightheaded and that it occurs mostly when she is eating.   Please advise at Mobile [phone] (mobile)  Message is routed to Provider Pool and [facility] Triage

## 2022-06-04 ENCOUNTER — Ambulatory Visit (INDEPENDENT_AMBULATORY_CARE_PROVIDER_SITE_OTHER): Payer: Medicaid Other | Admitting: Nurse Practitioner

## 2022-06-04 ENCOUNTER — Encounter: Payer: Self-pay | Admitting: Nurse Practitioner

## 2022-06-04 VITALS — BP 100/64 | HR 88 | Temp 99.5°F | Resp 16 | Ht 62.0 in | Wt 171.5 lb

## 2022-06-04 DIAGNOSIS — F32A Depression, unspecified: Secondary | ICD-10-CM | POA: Diagnosis not present

## 2022-06-04 DIAGNOSIS — F419 Anxiety disorder, unspecified: Secondary | ICD-10-CM | POA: Diagnosis not present

## 2022-06-04 DIAGNOSIS — R110 Nausea: Secondary | ICD-10-CM | POA: Insufficient documentation

## 2022-06-04 DIAGNOSIS — R42 Dizziness and giddiness: Secondary | ICD-10-CM | POA: Diagnosis not present

## 2022-06-04 LAB — COMPREHENSIVE METABOLIC PANEL
ALT: 16 U/L (ref 0–35)
AST: 18 U/L (ref 0–37)
Albumin: 4.2 g/dL (ref 3.5–5.2)
Alkaline Phosphatase: 67 U/L (ref 39–117)
BUN: 9 mg/dL (ref 6–23)
CO2: 25 mEq/L (ref 19–32)
Calcium: 9.2 mg/dL (ref 8.4–10.5)
Chloride: 105 mEq/L (ref 96–112)
Creatinine, Ser: 0.58 mg/dL (ref 0.40–1.20)
GFR: 112.74 mL/min (ref >60.00)
Glucose, Bld: 91 mg/dL (ref 70–99)
Potassium: 4.1 mEq/L (ref 3.5–5.1)
Sodium: 137 mEq/L (ref 135–145)
Total Bilirubin: 0.4 mg/dL (ref 0.2–1.2)
Total Protein: 6.8 g/dL (ref 6.0–8.3)

## 2022-06-04 LAB — CBC
HCT: 43.4 % (ref 36.0–46.0)
Hemoglobin: 14.6 g/dL (ref 12.0–15.0)
MCHC: 33.6 g/dL (ref 30.0–36.0)
MCV: 91.3 fl (ref 78.0–100.0)
Platelets: 274 10*3/uL (ref 150.0–400.0)
RBC: 4.76 Mil/uL (ref 3.87–5.11)
RDW: 12.5 % (ref 11.5–15.5)
WBC: 12.6 10*3/uL — ABNORMAL HIGH (ref 4.0–10.5)

## 2022-06-04 LAB — TSH: TSH: 1.07 u[IU]/mL (ref 0.35–5.50)

## 2022-06-04 MED ORDER — VENLAFAXINE HCL ER 37.5 MG PO CP24: 37.5000 mg | ORAL_CAPSULE | Freq: Every day | ORAL | 1 refills | Status: AC

## 2022-06-04 NOTE — Progress Notes (Signed)
Acute Office Visit  Subjective:     Patient ID: Amy Marshall, female    DOB: 20-Aug-1980, 42 y.o.   MRN: [id number]  Chief Complaint  Patient presents with   Dizziness    X 3 weeks off and on  mainly happening when she eats.   Nausea     Patient is in today for dizziness and nausea with a history of migraines, eustachian tube dysfunction, cholecystectomy and dyspepsia.  States when she messaged me about the igraine medication she was really dizzy. States that it lasted fo rmost of the day. States later that day she felt like she was going to get a migraine. States that since then it has not been as severe. States that it will happen and she will get light headed. States that she iwll get lightheaded when she eats. States yesterday she felt nausous and tried to eat.  States that the lightheadedness is not with position changes. Sttes that she can be walking and then when she stopped she flet it. Last night she was eating a salad it would happen. States that it did go away. States that it will not last long. States last night when she go home shse felt lightheaded. States that she has increased her water intake, states that she drinks approx 1 pot of coffee a day. States that she uses a little bit of milk and decreased the amount of sugar in the coffe. States that she will do unsweet tea. States that she does only have one meal a day. States that if she snakcs it is chips or a salad  Anxiety and depression: States that she  Review of Systems  Constitutional:  Negative for chills and fever.  Respiratory:  Negative for shortness of breath.   Cardiovascular:  Negative for chest pain.  Gastrointestinal:  Positive for nausea. Negative for abdominal pain, constipation, diarrhea and vomiting.  Neurological:  Positive for dizziness. Negative for headaches.        Objective:    BP 100/64   Pulse 88   Temp 99.5 F (37.5 C)   Resp 16   Ht 5\' 2"  (1.575 m)   Wt 171 lb 8 oz (77.8 kg)    SpO2 98%   BMI 31.37 kg/m  BP Readings from Last 3 Encounters:  06/04/22 100/64  03/05/22 110/66  01/28/22 108/68   Wt Readings from Last 3 Encounters:  06/04/22 171 lb 8 oz (77.8 kg)  03/05/22 175 lb 4 oz (79.5 kg)  01/28/22 177 lb (80.3 kg)      Physical Exam Vitals and nursing note reviewed.  Constitutional:      Appearance: Normal appearance.  HENT:     Right Ear: Tympanic membrane, ear canal and external ear normal.     Left Ear: Tympanic membrane, ear canal and external ear normal.     Mouth/Throat:     Mouth: Mucous membranes are moist.     Pharynx: Oropharynx is clear.  Eyes:     Extraocular Movements: Extraocular movements intact.     Pupils: Pupils are equal, round, and reactive to light.  Neck:     Vascular: No carotid bruit.  Cardiovascular:     Rate and Rhythm: Normal rate and regular rhythm.     Heart sounds: Normal heart sounds.  Pulmonary:     Effort: Pulmonary effort is normal.     Breath sounds: Normal breath sounds.  Abdominal:     General: Bowel sounds are normal. There is no distension.  Palpations: There is no mass.     Tenderness: There is no abdominal tenderness.     Hernia: No hernia is present.  Lymphadenopathy:     Cervical: No cervical adenopathy.  Neurological:     General: No focal deficit present.     Mental Status: She is alert.     Coordination: Finger-Nose-Finger Test normal.     Deep Tendon Reflexes:     Reflex Scores:      Bicep reflexes are 2+ on the right side and 2+ on the left side.      Patellar reflexes are 2+ on the right side and 2+ on the left side.    Comments: Bilateral upper and lower extremity strength 5/5     No results found for any visits on 06/04/22.      Assessment & Plan:   Problem List Items Addressed This Visit       Other   Anxiety and depression   Relevant Medications   venlafaxine XR (EFFEXOR XR) 37.5 MG 24 hr capsule   Nausea    Ambiguous in nature.  Patient does have history of  NSAID use with GERD.  She has cut back on NSAID use over the past week.  Benign exam today if she continues having trouble we will put her on a PPI for up to 3 months.      Relevant Orders   CBC   TSH   Comprehensive metabolic panel   Lightheadedness - Primary    Multifactorial.  Encouraged adequate hydration reduction in caffeine consumption and eating more than 1 time a day.  Neurological exam totally benign in office.  Will check basic labs      Relevant Orders   CBC   TSH   Comprehensive metabolic panel    Meds ordered this encounter  Medications   venlafaxine XR (EFFEXOR XR) 37.5 MG 24 hr capsule    Sig: Take 1 capsule (37.5 mg total) by mouth daily with breakfast.    Dispense:  90 capsule    Refill:  1    Return if symptoms worsen or fail to improve.  Audria Nine, NP

## 2022-06-04 NOTE — Assessment & Plan Note (Signed)
Multifactorial.  Encouraged adequate hydration reduction in caffeine consumption and eating more than 1 time a day.  Neurological exam totally benign in office.  Will check basic labs

## 2022-06-04 NOTE — Patient Instructions (Signed)
Nice to see you today Try and eat more often throughout the day Drink plenty of water Try and reduce the amount of caffeine that you consume Let me know if you do not improve

## 2022-06-04 NOTE — Assessment & Plan Note (Signed)
Ambiguous in nature.  Patient does have history of NSAID use with GERD.  She has cut back on NSAID use over the past week.  Benign exam today if she continues having trouble we will put her on a PPI for up to 3 months.

## 2022-06-07 ENCOUNTER — Other Ambulatory Visit (INDEPENDENT_AMBULATORY_CARE_PROVIDER_SITE_OTHER): Payer: Medicaid Other

## 2022-06-07 DIAGNOSIS — D72829 Elevated white blood cell count, unspecified: Secondary | ICD-10-CM

## 2022-06-07 LAB — WHITE CELL DIFFERENTIAL
Band Neutrophils: 0 %
Basophils Relative: 0.5 % (ref 0.0–3.0)
Eosinophils Relative: 1.5 % (ref 0.0–5.0)
Lymphocytes Relative: 15.5 % (ref 12.0–46.0)
Monocytes Relative: 4.7 % (ref 3.0–12.0)
Neutrophils Relative %: 77.8 % — ABNORMAL HIGH (ref 43.0–77.0)

## 2023-01-20 ENCOUNTER — Other Ambulatory Visit (HOSPITAL_COMMUNITY): Payer: Self-pay

## 2023-01-21 ENCOUNTER — Telehealth: Payer: Self-pay

## 2023-01-21 ENCOUNTER — Other Ambulatory Visit (HOSPITAL_COMMUNITY): Payer: Self-pay

## 2023-01-21 NOTE — Telephone Encounter (Signed)
*  Primary  Pharmacy Patient Advocate Encounter   Received notification from CoverMyMeds that prior authorization for Nurtec 75MG  dispersible tablets  is required/requested.   Insurance verification completed.   The patient is insured through [facility] MEDICAID .   Per test claim: PA required; PA submitted to above mentioned insurance via [facility] Tracks Key/confirmation #/EOC [id number] W Status is pending

## 2023-01-22 NOTE — Telephone Encounter (Signed)
 PA denied. No denial reason or letter received.
